# Patient Record
Sex: Female | Born: 1950 | Race: White | Hispanic: No | Marital: Married | Smoking: Never smoker
Health system: Southern US, Community
[De-identification: ages and names within clinical notes are randomized; demographics above are authoritative.]

## PROBLEM LIST (undated history)

## (undated) DIAGNOSIS — K224 Dyskinesia of esophagus: Secondary | ICD-10-CM

## (undated) DIAGNOSIS — R112 Nausea with vomiting, unspecified: Secondary | ICD-10-CM

## (undated) DIAGNOSIS — C801 Malignant (primary) neoplasm, unspecified: Secondary | ICD-10-CM

## (undated) DIAGNOSIS — I10 Essential (primary) hypertension: Secondary | ICD-10-CM

## (undated) DIAGNOSIS — K219 Gastro-esophageal reflux disease without esophagitis: Secondary | ICD-10-CM

## (undated) DIAGNOSIS — M1990 Unspecified osteoarthritis, unspecified site: Secondary | ICD-10-CM

## (undated) DIAGNOSIS — F419 Anxiety disorder, unspecified: Secondary | ICD-10-CM

## (undated) DIAGNOSIS — Z9889 Other specified postprocedural states: Secondary | ICD-10-CM

## (undated) HISTORY — PX: OTHER SURGICAL HISTORY: SHX169

## (undated) HISTORY — PX: KNEE SURGERY: SHX244

## (undated) HISTORY — PX: TUBAL LIGATION: SHX77

---

## 1978-11-29 HISTORY — PX: DILATION AND CURETTAGE OF UTERUS: SHX78

## 1998-08-06 ENCOUNTER — Ambulatory Visit (HOSPITAL_COMMUNITY): Admission: RE | Admit: 1998-08-06 | Discharge: 1998-08-06 | Payer: Self-pay | Admitting: Thoracic Surgery

## 2000-03-07 ENCOUNTER — Encounter: Payer: Self-pay | Admitting: Thoracic Surgery

## 2000-03-08 ENCOUNTER — Encounter (INDEPENDENT_AMBULATORY_CARE_PROVIDER_SITE_OTHER): Payer: Self-pay | Admitting: *Deleted

## 2000-03-08 ENCOUNTER — Ambulatory Visit (HOSPITAL_COMMUNITY): Admission: RE | Admit: 2000-03-08 | Discharge: 2000-03-08 | Payer: Self-pay | Admitting: Thoracic Surgery

## 2002-04-11 ENCOUNTER — Encounter: Admission: RE | Admit: 2002-04-11 | Discharge: 2002-04-11 | Payer: Self-pay | Admitting: Family Medicine

## 2002-04-11 ENCOUNTER — Encounter: Payer: Self-pay | Admitting: Family Medicine

## 2002-05-28 ENCOUNTER — Other Ambulatory Visit: Admission: RE | Admit: 2002-05-28 | Discharge: 2002-05-28 | Payer: Self-pay | Admitting: *Deleted

## 2003-12-12 ENCOUNTER — Encounter: Admission: RE | Admit: 2003-12-12 | Discharge: 2003-12-12 | Payer: Self-pay | Admitting: Family Medicine

## 2004-02-20 ENCOUNTER — Ambulatory Visit (HOSPITAL_COMMUNITY): Admission: RE | Admit: 2004-02-20 | Discharge: 2004-02-20 | Payer: Self-pay | Admitting: *Deleted

## 2005-06-03 ENCOUNTER — Other Ambulatory Visit: Admission: RE | Admit: 2005-06-03 | Discharge: 2005-06-03 | Payer: Self-pay | Admitting: *Deleted

## 2008-05-14 ENCOUNTER — Encounter: Admission: RE | Admit: 2008-05-14 | Discharge: 2008-05-14 | Payer: Self-pay | Admitting: Obstetrics and Gynecology

## 2009-08-27 ENCOUNTER — Encounter: Admission: RE | Admit: 2009-08-27 | Discharge: 2009-08-27 | Payer: Self-pay | Admitting: Obstetrics and Gynecology

## 2010-04-18 ENCOUNTER — Emergency Department (HOSPITAL_BASED_OUTPATIENT_CLINIC_OR_DEPARTMENT_OTHER): Admission: EM | Admit: 2010-04-18 | Discharge: 2010-04-18 | Payer: Self-pay | Admitting: Emergency Medicine

## 2010-04-18 ENCOUNTER — Ambulatory Visit: Payer: Self-pay | Admitting: Diagnostic Radiology

## 2010-09-07 ENCOUNTER — Encounter: Admission: RE | Admit: 2010-09-07 | Discharge: 2010-09-07 | Payer: Self-pay | Admitting: Family Medicine

## 2011-02-15 LAB — BASIC METABOLIC PANEL
Calcium: 9.6 mg/dL (ref 8.4–10.5)
GFR calc Af Amer: >60 mL/min (ref >60)
GFR calc non Af Amer: >60 mL/min (ref >60)
Glucose, Bld: 106 mg/dL — ABNORMAL HIGH (ref 70–99)
Potassium: 3.6 mEq/L (ref 3.5–5.1)
Sodium: 143 mEq/L (ref 135–145)

## 2011-02-15 LAB — CBC
HCT: 39.2 % (ref 36.0–46.0)
Hemoglobin: 13.3 g/dL (ref 12.0–15.0)
RBC: 4.78 MIL/uL (ref 3.87–5.11)
RDW: 13.5 % (ref 11.5–15.5)
WBC: 8.9 10*3/uL (ref 4.0–10.5)

## 2011-02-15 LAB — POCT CARDIAC MARKERS
CKMB, poc: <1 ng/mL — ABNORMAL LOW (ref 1.0–8.0)
Myoglobin, poc: 54 ng/mL (ref 12–200)
Troponin i, poc: <0.05 ng/mL (ref 0.00–0.09)

## 2012-11-29 HISTORY — PX: KNEE SURGERY: SHX244

## 2013-03-12 ENCOUNTER — Other Ambulatory Visit: Payer: Self-pay

## 2013-03-12 DIAGNOSIS — Z1231 Encounter for screening mammogram for malignant neoplasm of breast: Secondary | ICD-10-CM

## 2013-03-14 ENCOUNTER — Ambulatory Visit
Admission: RE | Admit: 2013-03-14 | Discharge: 2013-03-14 | Disposition: A | Discharge status: Home / Self Care | Payer: 59 | Source: Ambulatory Visit

## 2013-03-14 DIAGNOSIS — Z1231 Encounter for screening mammogram for malignant neoplasm of breast: Secondary | ICD-10-CM

## 2013-03-15 ENCOUNTER — Ambulatory Visit: Payer: Self-pay

## 2013-10-22 ENCOUNTER — Other Ambulatory Visit: Payer: Self-pay | Admitting: Gastroenterology

## 2013-10-22 DIAGNOSIS — R1310 Dysphagia, unspecified: Secondary | ICD-10-CM

## 2013-10-23 ENCOUNTER — Ambulatory Visit
Admission: RE | Admit: 2013-10-23 | Discharge: 2013-10-23 | Disposition: A | Discharge status: Home / Self Care | Payer: 59 | Source: Ambulatory Visit | Attending: Gastroenterology | Admitting: Gastroenterology

## 2013-10-23 DIAGNOSIS — R1310 Dysphagia, unspecified: Secondary | ICD-10-CM

## 2013-11-28 ENCOUNTER — Ambulatory Visit: Payer: 59 | Admitting: Internal Medicine

## 2014-10-02 ENCOUNTER — Encounter (HOSPITAL_COMMUNITY): Payer: Self-pay | Admitting: Emergency Medicine

## 2014-10-02 ENCOUNTER — Emergency Department (HOSPITAL_COMMUNITY)
Admission: EM | Admit: 2014-10-02 | Discharge: 2014-10-02 | Disposition: A | Discharge status: Home / Self Care | Payer: BC Managed Care – PPO | Attending: Emergency Medicine | Admitting: Emergency Medicine

## 2014-10-02 DIAGNOSIS — R1310 Dysphagia, unspecified: Secondary | ICD-10-CM | POA: Diagnosis present

## 2014-10-02 DIAGNOSIS — Z7982 Long term (current) use of aspirin: Secondary | ICD-10-CM | POA: Insufficient documentation

## 2014-10-02 DIAGNOSIS — K224 Dyskinesia of esophagus: Secondary | ICD-10-CM | POA: Insufficient documentation

## 2014-10-02 DIAGNOSIS — M542 Cervicalgia: Secondary | ICD-10-CM | POA: Insufficient documentation

## 2014-10-02 DIAGNOSIS — Z8719 Personal history of other diseases of the digestive system: Secondary | ICD-10-CM | POA: Diagnosis not present

## 2014-10-02 DIAGNOSIS — Z79899 Other long term (current) drug therapy: Secondary | ICD-10-CM | POA: Insufficient documentation

## 2014-10-02 DIAGNOSIS — I10 Essential (primary) hypertension: Secondary | ICD-10-CM | POA: Insufficient documentation

## 2014-10-02 HISTORY — DX: Essential (primary) hypertension: I10

## 2014-10-02 HISTORY — DX: Gastro-esophageal reflux disease without esophagitis: K21.9

## 2014-10-02 MED ORDER — GLUCAGON HCL RDNA (DIAGNOSTIC) 1 MG IJ SOLR
1.0000 mg | Freq: Once | INTRAMUSCULAR | Status: DC
  Filled 2014-10-02: qty 1

## 2014-10-02 MED ORDER — IBUPROFEN 100 MG/5ML PO SUSP
400.0000 mg | Freq: Once | ORAL | Status: AC
  Administered 2014-10-02: 400 mg via ORAL
  Filled 2014-10-02: qty 20

## 2014-10-02 NOTE — Progress Notes (Signed)
  CARE MANAGEMENT ED NOTE 10/02/2014  Patient:  Courtney Hurley,Courtney Hurley   Account Number:  [id number]  Date Initiated:  10/02/2014  Documentation initiated by:  Radford PaxFERRERO,Kara Mierzejewski  Subjective/Objective Assessment:   Patient presents to Ed with upper chest tightness rediating wit throat with spasms.     Subjective/Objective Assessment Detail:   Patient with pmhx of GERD     Action/Plan:   Action/Plan Detail:   Anticipated DC Date:       Status Recommendation to Physician:   Result of Recommendation:    Other ED Services  Consult Working Plan    DC Planning Services  Other  PCP issues    Choice offered to / List presented to:            Status of service:  Completed, signed off  ED Comments:   ED Comments Detail:  EDCM spoke to patient ta bedside.  Patient confirms her pcp is Dr. Miguel AschoffAllan ross.  Patinet also reports her GI specialist id [facility]  System updated.

## 2014-10-02 NOTE — ED Notes (Signed)
Bed: WA14 Expected date:  Expected time:  Means of arrival:  Comments: Triage 2  

## 2014-10-02 NOTE — ED Notes (Signed)
Patient states her spasms have stopped for the past 20 minutes. Patient states previously the spasms were occuring every 5 minutes, over the prior 5 hours.

## 2014-10-02 NOTE — ED Provider Notes (Signed)
CSN: [id number]     Arrival date & time 10/02/14  2015 History   First MD Initiated Contact with Patient 10/02/14 2048     Chief Complaint  Patient presents with  . Foreign Body     (Consider location/radiation/quality/duration/timing/severity/associated sxs/prior Treatment) HPI [name] is a 63 year old female with past medical history of GERD, hypertension who presents to the ER with the sensation of a foreign body stuck in her esophagus. Patient states she was eating chicken tonight, and after her first bite she noticed a sudden onset of pain in her throat. Patient states since then she has been vomiting saliva. She states she was unable to get the chicken back up, and since then has not been able to swallow solid or liquid.patient reports a similar episode approximately 6 months ago for which she saw gastroenterology. Patient states the episode tonight feels identical to then. She placed on Zantac and since has had a negative endoscopy approximately 1-2 months ago. On presentation, patient is unable to tolerate any of her secretions or liquids. Patient denies any nausea, shortness of breath, chest pain, dizziness, weakness, syncope, abdominal pain, diarrhea, dysuria.  Past Medical History  Diagnosis Date  . GERD (gastroesophageal reflux disease)   . Hypertension    Past Surgical History  Procedure Laterality Date  . Knee surgery     History reviewed. No pertinent family history. History  Substance Use Topics  . Smoking status: Never Smoker   . Smokeless tobacco: Not on file  . Alcohol Use: No   OB History    No data available     Review of Systems  Constitutional: Negative for fever.  HENT: Negative for trouble swallowing.   Eyes: Negative for visual disturbance.  Respiratory: Negative for shortness of breath.   Cardiovascular: Negative for chest pain.  Gastrointestinal: Negative for nausea, vomiting and abdominal pain.       Dysphagia  Genitourinary: Negative for  dysuria.  Musculoskeletal: Positive for neck pain.       Anterior neck pain superior to sternal notch.  Skin: Negative for rash.  Neurological: Negative for dizziness, weakness and numbness.  Psychiatric/Behavioral: Negative.       Allergies  Review of patient's allergies indicates no known allergies.  Home Medications   Prior to Admission medications   Medication Sig Start Date End Date Taking? Authorizing Provider  aspirin 81 MG tablet Take 81 mg by mouth daily.   Yes Historical Provider, MD  FLUoxetine (PROZAC) 20 MG capsule Take 20 mg by mouth daily.   Yes Historical Provider, MD  Multiple Vitamins-Minerals (MULTIVITAMIN & MINERAL PO) Take 1 tablet by mouth daily.   Yes Historical Provider, MD  Nutritional Supplements (NUTRITIONAL SUPPLEMENT PO) Take 1 tablet by mouth daily.   Yes Historical Provider, MD  olmesartan (BENICAR) 20 MG tablet Take 20 mg by mouth daily.   Yes Historical Provider, MD   BP 125/61 mmHg  Pulse 76  Temp(Src) 98.7 F (37.1 C) (Oral)  Resp 16  Ht 5\' 6"  (1.676 m)  Wt 192 lb (87.091 kg)  BMI 31.00 kg/m2  SpO2 98% Physical Exam  Constitutional: She appears well-developed and well-nourished. No distress.  HENT:  Head: Normocephalic and atraumatic.  Mouth/Throat: Oropharynx is clear and moist. No oropharyngeal exudate.  Eyes: Right eye exhibits no discharge. Left eye exhibits no discharge. No scleral icterus.  Neck: Normal range of motion.  Cardiovascular: Normal rate, regular rhythm and normal heart sounds.   No murmur heard. Pulmonary/Chest: Effort normal and breath sounds  normal. No respiratory distress.  Abdominal: Soft. There is no tenderness.  Musculoskeletal: Normal range of motion. She exhibits no edema or tenderness.  Neurological: She is alert. She has normal strength. No cranial nerve deficit or sensory deficit. Coordination normal. GCS eye subscore is 4. GCS verbal subscore is 5. GCS motor subscore is 6.  Patient fully alert, answering  questions appropriately in full, clear sentences.  Skin: Skin is warm and dry. No rash noted. She is not diaphoretic.  Psychiatric: She has a normal mood and affect.  Nursing note and vitals reviewed.   ED Course  Procedures (including critical care time) Labs Review Labs Reviewed - No data to display  Imaging Review No results found.   EKG Interpretation   Date/Time:  Wednesday October 02 2014 20:27:55 EST Ventricular Rate:  84 PR Interval:  164 QRS Duration: 104 QT Interval:  524 QTC Calculation: 619 R Axis:   -77 Text Interpretation:  Normal sinus rhythm Incomplete right bundle branch  block Left anterior fascicular block Prolonged QT Abnormal ECG No  significant change since last tracing Confirmed by [facility]  MD, MARTHA  [phone]) on 10/02/2014 8:53:23 PM      MDM   Final diagnoses:  Esophageal spasm    Hx of esophageal motility disorder, feeling FB in esophagus tonight, unable to tolerate liquids now.  Will try Glucagon.  Patient well-appearing, and in no obvious distress. Patient's pain is located in her throat, and she is stating is identical to a neck and discomfort she had with a previous spasm of her esophagus. No concern for ACS, foreign body in the airway. No concern for aspiration at this point.  Just prior to patient receiving IV for glucagon, patient states her symptoms have completely resolved. Patient reporting that she is asymptomatic, and willing to attempt by mouth challenge with water.  Patient able to drink fluids without any difficulty. Patient tolerating her secretions well, has drank approximately 8-10 ounces of water, and is requesting to go home. Since patient is able to tolerate her own secretions at this point, we will discharge patient home and have her follow-up with her gastroenterologist as an outpatient. I recommended soft foods for patient including yogurt and plenty of fluids as much as she can tolerate tonight. I discussed return precautions  with patient, and patient was agreeable to this plan. I encouraged patient to call or return to the ER should her symptoms return, persist, change or should she have any questions or concerns.  BP 125/61 mmHg  Pulse 76  Temp(Src) 98.7 F (37.1 C) (Oral)  Resp 16  Ht 5\' 6"  (1.676 m)  Wt 192 lb (87.091 kg)  BMI 31.00 kg/m2  SpO2 98%  Signed,  Ladona MowJoe Fayth Trefry, PA-C 2:11 AM  This patient discussed with Dr. Jerelyn ScottMartha Linker, M.D.  Monte FantasiaJoseph W Braya Habermehl, PA-C 10/03/14 [phone]  Ethelda ChickMartha K Linker, MD 10/03/14 [phone]

## 2014-10-02 NOTE — ED Notes (Addendum)
Pt presents with upper central chest tightness radiating to throat, describes as waves of spasms. Pt states this happened before but was brief and had complete GI workup. Pt states she is only able to get saliva to come up. Pt states this began while eating salad and chicken. NAD, A & O Pt given water to attempt to swallow, pt immediately vomited water up.

## 2014-10-02 NOTE — ED Notes (Signed)
Patient states she is feeling much better and would like to go home. PA updated.

## 2014-10-02 NOTE — ED Notes (Signed)
Patient given 1/2 cup water, drank from open cup, small sips, was able to drink without difficulty. Patient given ginger-ale, requested patient to sip slowly. Patient c/o headache 3/10, would like something for headache, is concerned about swallowing pills. Will notify PA.

## 2014-10-02 NOTE — Discharge Instructions (Signed)
Esophageal Spasm °Esophageal spasm is an uncoordinated contraction of the muscles of the esophagus (the tube which carries food from your mouth to your stomach). Normally, the muscles of the esophagus alternate between contraction and relaxation starting from the top of the esophagus and working down to the bottom. This moves the food from the mouth to the stomach. In esophageal spasm, all the muscles contract at once. This causes pain and fails to move the food along. As a result, you may have trouble swallowing.  °Women are more likely than men to have esophageal spasm. The cause of the spasms is not known. Sometimes eating hot or cold foods triggers the condition and this may be due to an overly sensitive esophagus. This is not an infectious disease and cannot be passed to others. °SYMPTOMS  °Symptoms of esophageal spasm may include: chest pain, burning or pain with swallowing, and difficulty swallowing.  °DIAGNOSIS  °Esophageal spasm can be diagnosed by a test called manometry (pressure studies of the esophagus). In this test, a special tube is inserted down the esophagus. The tube measures the muscle activity of the esophagus. Abnormal contractions mixed with normal movement helps confirm the diagnosis.  °A person with a hypersensitive esophagus may be diagnosed by inflating a long balloon in the person's esophagus. If this causes the same symptoms, preventive methods may work. °PREVENTION  °Avoid hot or cold foods if that seems to be a trigger. °PROGNOSIS  °This condition does not go away, nor is treatment entirely satisfactory. Patients need to be careful of what they eat. They need to continue on medication if a useful one is found. Fortunately, the condition does not get progressively worse as time passes. °Esophageal spasm does not usually lead to more serious problems but sometimes the pain can be disabling. If a person becomes afraid to eat they may become malnourished and lose weight.  °TREATMENT  °· A  procedure in which instruments of increasing size are inserted through the esophagus to enlarge (dilate) it are used. °· Medications that decrease acid-production of the stomach may be used such as proton-pump inhibitors or H2-blockers. °· Medications of several types can be used to relax the muscles of the esophagus. °· An individual with a hypersensitive esophagus sometimes improves with low doses of medications normally used for depression. °· No treatment for esophageal spasm is effective for everyone. Often several approaches will be tried before one works. In many cases, the symptoms will improve, but will not go away completely. °· For severe cases, relief is obtained two-thirds of the time by cutting the muscles along the entire length of the esophagus. This is a major surgical procedure. °· Your symptoms are usually the best guide to how well the treatment for esophageal spasm works. °SIDE EFFECTS OF TREATMENTS °· Nitrates can cause headaches and low blood pressure. °· Calcium channel blockers can cause: °¨ Feeling sick to your stomach (nausea). °¨ Constipation and other side effects. °· Antidepressants can cause side effects that depend on the medication used. °HOME CARE INSTRUCTIONS  °· Let your caregiver know if problems are getting worse, or if you get food stuck in your esophagus for longer than 1 hour or as directed and are unable to swallow liquid. °· Take medications as directed and with permission of your caregiver. Ask about what to do if a medication seems to get stuck in your esophagus. Only take over-the-counter or prescription medicines for pain, discomfort, or fever as directed by your caregiver. °· Soft and liquid foods   pass more easily than solid pieces. °SEEK IMMEDIATE MEDICAL CARE IF:  °· You develop severe chest pain, especially if the pain is crushing or pressure-like and spreads to the arms, back, neck, or jaw, or if you have sweating, nausea, or shortness of breath. THIS COULD BE AN  EMERGENCY. Do not wait to see if the pain will go away. Get medical help at once. Call 911 or 0 (operator). DO NOT drive yourself to the hospital. °· Your chest pain gets worse and does not go away with rest. °· You have an attack of chest pain lasting longer than usual despite rest and treatment with the medications your physician has prescribed. °· You wake from sleep with chest pain or shortness of breath. °· You feel dizzy or faint. °· You have chest pain, not typical of your usual pain, caused by your esophagus for which you originally saw your caregiver. °MAKE SURE YOU:  °· Understand these instructions. °· Will watch your condition. °· Will get help right away if you are not doing well or get worse. °Document Released: 02/05/2003 Document Revised: 02/07/2012 Document Reviewed: 02/08/2014 °[employer]® Patient Information ©2015 [employer]. This information is not intended to replace advice given to you by your health care provider. Make sure you discuss any questions you have with your health care provider. ° °

## 2014-10-03 NOTE — ED Notes (Signed)
Patient needed d/c paper work

## 2015-03-07 ENCOUNTER — Other Ambulatory Visit: Payer: Self-pay | Admitting: Orthopedic Surgery

## 2015-03-07 DIAGNOSIS — M1712 Unilateral primary osteoarthritis, left knee: Secondary | ICD-10-CM

## 2015-05-19 ENCOUNTER — Other Ambulatory Visit: Payer: Self-pay | Admitting: Obstetrics and Gynecology

## 2015-05-19 DIAGNOSIS — E2839 Other primary ovarian failure: Secondary | ICD-10-CM

## 2015-06-04 ENCOUNTER — Ambulatory Visit
Admission: RE | Admit: 2015-06-04 | Discharge: 2015-06-04 | Disposition: A | Discharge status: Home / Self Care | Payer: Medicare Other | Source: Ambulatory Visit | Attending: Orthopedic Surgery | Admitting: Orthopedic Surgery

## 2015-06-04 DIAGNOSIS — M1712 Unilateral primary osteoarthritis, left knee: Secondary | ICD-10-CM

## 2015-07-04 ENCOUNTER — Other Ambulatory Visit: Payer: BC Managed Care – PPO

## 2015-07-14 ENCOUNTER — Ambulatory Visit: Payer: Self-pay | Admitting: Orthopedic Surgery

## 2015-07-14 NOTE — Progress Notes (Signed)
Preoperative surgical orders have been place into the [facility] hospital system for Courtney Hurley on 07/14/2015, 12:17 PM  by Patrica Duel for surgery on 07/28/2015.  Preop Uni Knee orders including Experal, IV Tylenol, and IV Decadron as long as there are no contraindications to the above medications. Avel Peace, PA-C

## 2015-07-15 NOTE — Patient Instructions (Signed)
HAELI GERLICH  07/15/2015   Your procedure is scheduled on: 07/28/2015    Report to [facility] Main  Entrance take [facility]  elevators to 3rd floor to  Short Stay Center at    0600 AM.  Call this number if you have problems the morning of surgery [phone]   Remember: ONLY 1 PERSON MAY GO WITH YOU TO SHORT STAY TO GET  READY MORNING OF YOUR SURGERY.  Do not eat food or drink liquids :After Midnight.     Take these medicines the morning of surgery with A SIP OF WATER: none                                You may not have any metal on your body including hair pins and              piercings  Do not wear jewelry, make-up, lotions, powders or perfumes, deodorant             Do not wear nail polish.  Do not shave  48 hours prior to surgery.               Do not bring valuables to the hospital. [facility] IS NOT             RESPONSIBLE   FOR VALUABLES.  Contacts, dentures or bridgework may not be worn into surgery.  Leave suitcase in the car. After surgery it may be brought to your room.         Special Instructions: coughing and deep breathing exercises, leg exercises               Please read over the following fact sheets you were given: _____________________________________________________________________             [facility] - Preparing for Surgery Before surgery, you can play an important role.  Because skin is not sterile, your skin needs to be as free of germs as possible.  You can reduce the number of germs on your skin by washing with CHG (chlorahexidine gluconate) soap before surgery.  CHG is an antiseptic cleaner which kills germs and bonds with the skin to continue killing germs even after washing. Please DO NOT use if you have an allergy to CHG or antibacterial soaps.  If your skin becomes reddened/irritated stop using the CHG and inform your nurse when you arrive at Short Stay. Do not shave (including legs and underarms) for at least 48  hours prior to the first CHG shower.  You may shave your face/neck. Please follow these instructions carefully:  1.  Shower with CHG Soap the night before surgery and the  morning of Surgery.  2.  If you choose to wash your hair, wash your hair first as usual with your  normal  shampoo.  3.  After you shampoo, rinse your hair and body thoroughly to remove the  shampoo.                           4.  Use CHG as you would any other liquid soap.  You can apply chg directly  to the skin and wash                       Gently with a scrungie  or clean washcloth.  5.  Apply the CHG Soap to your body ONLY FROM THE NECK DOWN.   Do not use on face/ open                           Wound or open sores. Avoid contact with eyes, ears mouth and genitals (private parts).                       Wash face,  Genitals (private parts) with your normal soap.             6.  Wash thoroughly, paying special attention to the area where your surgery  will be performed.  7.  Thoroughly rinse your body with warm water from the neck down.  8.  DO NOT shower/wash with your normal soap after using and rinsing off  the CHG Soap.                9.  Pat yourself dry with a clean towel.            10.  Wear clean pajamas.            11.  Place clean sheets on your bed the night of your first shower and do not  sleep with pets. Day of Surgery : Do not apply any lotions/deodorants the morning of surgery.  Please wear clean clothes to the hospital/surgery center.  FAILURE TO FOLLOW THESE INSTRUCTIONS MAY RESULT IN THE CANCELLATION OF YOUR SURGERY PATIENT SIGNATURE_________________________________  NURSE SIGNATURE__________________________________  ________________________________________________________________________  WHAT IS A BLOOD TRANSFUSION? Blood Transfusion Information  A transfusion is the replacement of blood or some of its parts. Blood is made up of multiple cells which provide different functions.  Red blood cells  carry oxygen and are used for blood loss replacement.  White blood cells fight against infection.  Platelets control bleeding.  Plasma helps clot blood.  Other blood products are available for specialized needs, such as hemophilia or other clotting disorders. BEFORE THE TRANSFUSION  Who gives blood for transfusions?   Healthy volunteers who are fully evaluated to make sure their blood is safe. This is blood bank blood. Transfusion therapy is the safest it has ever been in the practice of medicine. Before blood is taken from a donor, a complete history is taken to make sure that person has no history of diseases nor engages in risky social behavior (examples are intravenous drug use or sexual activity with multiple partners). The donor's travel history is screened to minimize risk of transmitting infections, such as malaria. The donated blood is tested for signs of infectious diseases, such as HIV and hepatitis. The blood is then tested to be sure it is compatible with you in order to minimize the chance of a transfusion reaction. If you or a relative donates blood, this is often done in anticipation of surgery and is not appropriate for emergency situations. It takes many days to process the donated blood. RISKS AND COMPLICATIONS Although transfusion therapy is very safe and saves many lives, the main dangers of transfusion include:  1. Getting an infectious disease. 2. Developing a transfusion reaction. This is an allergic reaction to something in the blood you were given. Every precaution is taken to prevent this. The decision to have a blood transfusion has been considered carefully by your caregiver before blood is given. Blood is not given unless the benefits outweigh the risks. AFTER THE  TRANSFUSION  Right after receiving a blood transfusion, you will usually feel much better and more energetic. This is especially true if your red blood cells have gotten low (anemic). The transfusion  raises the level of the red blood cells which carry oxygen, and this usually causes an energy increase.  The nurse administering the transfusion will monitor you carefully for complications. HOME CARE INSTRUCTIONS  No special instructions are needed after a transfusion. You may find your energy is better. Speak with your caregiver about any limitations on activity for underlying diseases you may have. SEEK MEDICAL CARE IF:   Your condition is not improving after your transfusion.  You develop redness or irritation at the intravenous (IV) site. SEEK IMMEDIATE MEDICAL CARE IF:  Any of the following symptoms occur over the next 12 hours:  Shaking chills.  You have a temperature by mouth above 102 F (38.9 C), not controlled by medicine.  Chest, back, or muscle pain.  People around you feel you are not acting correctly or are confused.  Shortness of breath or difficulty breathing.  Dizziness and fainting.  You get a rash or develop hives.  You have a decrease in urine output.  Your urine turns a dark color or changes to pink, red, or brown. Any of the following symptoms occur over the next 10 days:  You have a temperature by mouth above 102 F (38.9 C), not controlled by medicine.  Shortness of breath.  Weakness after normal activity.  The white part of the eye turns yellow (jaundice).  You have a decrease in the amount of urine or are urinating less often.  Your urine turns a dark color or changes to pink, red, or brown. Document Released: 11/12/2000 Document Revised: 02/07/2012 Document Reviewed: 07/01/2008 Exit[employer] Patient Information 2014 [place], [place].  _______________________________________________________________________  Incentive Spirometer  An incentive spirometer is a tool that can help keep your lungs clear and active. This tool measures how well you are filling your lungs with each breath. Taking long deep breaths may help reverse or decrease the chance  of developing breathing (pulmonary) problems (especially infection) following:  A long period of time when you are unable to move or be active. BEFORE THE PROCEDURE   If the spirometer includes an indicator to show your best effort, your nurse or respiratory therapist will set it to a desired goal.  If possible, sit up straight or lean slightly forward. Try not to slouch.  Hold the incentive spirometer in an upright position. INSTRUCTIONS FOR USE  3. Sit on the edge of your bed if possible, or sit up as far as you can in bed or on a chair. 4. Hold the incentive spirometer in an upright position. 5. Breathe out normally. 6. Place the mouthpiece in your mouth and seal your lips tightly around it. 7. Breathe in slowly and as deeply as possible, raising the piston or the ball toward the top of the column. 8. Hold your breath for 3-5 seconds or for as long as possible. Allow the piston or ball to fall to the bottom of the column. 9. Remove the mouthpiece from your mouth and breathe out normally. 10. Rest for a few seconds and repeat Steps 1 through 7 at least 10 times every 1-2 hours when you are awake. Take your time and take a few normal breaths between deep breaths. 11. The spirometer may include an indicator to show your best effort. Use the indicator as a goal to work toward during each repetition.  12. After each set of 10 deep breaths, practice coughing to be sure your lungs are clear. If you have an incision (the cut made at the time of surgery), support your incision when coughing by placing a pillow or rolled up towels firmly against it. Once you are able to get out of bed, walk around indoors and cough well. You may stop using the incentive spirometer when instructed by your caregiver.  RISKS AND COMPLICATIONS  Take your time so you do not get dizzy or light-headed.  If you are in pain, you may need to take or ask for pain medication before doing incentive spirometry. It is harder to  take a deep breath if you are having pain. AFTER USE  Rest and breathe slowly and easily.  It can be helpful to keep track of a log of your progress. Your caregiver can provide you with a simple table to help with this. If you are using the spirometer at home, follow these instructions: SEEK MEDICAL CARE IF:   You are having difficultly using the spirometer.  You have trouble using the spirometer as often as instructed.  Your pain medication is not giving enough relief while using the spirometer.  You develop fever of 100.5 F (38.1 C) or higher. SEEK IMMEDIATE MEDICAL CARE IF:   You cough up bloody sputum that had not been present before.  You develop fever of 102 F (38.9 C) or greater.  You develop worsening pain at or near the incision site. MAKE SURE YOU:   Understand these instructions.  Will watch your condition.  Will get help right away if you are not doing well or get worse. Document Released: 03/28/2007 Document Revised: 02/07/2012 Document Reviewed: 05/29/2007 [facility] Patient Information 2014 [place], [place].   ________________________________________________________________________

## 2015-07-16 ENCOUNTER — Encounter (HOSPITAL_COMMUNITY)
Admission: RE | Admit: 2015-07-16 | Discharge: 2015-07-16 | Disposition: A | Discharge status: Home / Self Care | Payer: Medicare Other | Source: Ambulatory Visit | Attending: Orthopedic Surgery | Admitting: Orthopedic Surgery

## 2015-07-16 ENCOUNTER — Encounter (HOSPITAL_COMMUNITY): Payer: Self-pay

## 2015-07-16 DIAGNOSIS — Z01818 Encounter for other preprocedural examination: Secondary | ICD-10-CM | POA: Diagnosis present

## 2015-07-16 HISTORY — DX: Anxiety disorder, unspecified: F41.9

## 2015-07-16 HISTORY — DX: Dyskinesia of esophagus: K22.4

## 2015-07-16 HISTORY — DX: Other specified postprocedural states: Z98.890

## 2015-07-16 HISTORY — DX: Unspecified osteoarthritis, unspecified site: M19.90

## 2015-07-16 HISTORY — DX: Other specified postprocedural states: R11.2

## 2015-07-16 LAB — CBC
HCT: 38.5 % (ref 36.0–46.0)
HEMOGLOBIN: 12.8 g/dL (ref 12.0–15.0)
MCH: 27 pg (ref 26.0–34.0)
MCHC: 33.2 g/dL (ref 30.0–36.0)
MCV: 81.2 fL (ref 78.0–100.0)
PLATELETS: 233 10*3/uL (ref 150–400)
RBC: 4.74 MIL/uL (ref 3.87–5.11)
RDW: 13.5 % (ref 11.5–15.5)
WBC: 6.1 10*3/uL (ref 4.0–10.5)

## 2015-07-16 LAB — COMPREHENSIVE METABOLIC PANEL
ALK PHOS: 75 U/L (ref 38–126)
ALT: 22 U/L (ref 14–54)
ANION GAP: 8 (ref 5–15)
AST: 22 U/L (ref 15–41)
Albumin: 4.3 g/dL (ref 3.5–5.0)
BUN: 16 mg/dL (ref 6–20)
CALCIUM: 9.4 mg/dL (ref 8.9–10.3)
CO2: 29 mmol/L (ref 22–32)
Chloride: 100 mmol/L — ABNORMAL LOW (ref 101–111)
Creatinine, Ser: 0.7 mg/dL (ref 0.44–1.00)
Glucose, Bld: 106 mg/dL — ABNORMAL HIGH (ref 65–99)
Potassium: 3.3 mmol/L — ABNORMAL LOW (ref 3.5–5.1)
SODIUM: 137 mmol/L (ref 135–145)
Total Bilirubin: 0.9 mg/dL (ref 0.3–1.2)
Total Protein: 7.5 g/dL (ref 6.5–8.1)

## 2015-07-16 LAB — ABO/RH: ABO/RH(D): O POS

## 2015-07-16 LAB — PROTIME-INR
INR: 1 (ref 0.00–1.49)
PROTHROMBIN TIME: 13.4 s (ref 11.6–15.2)

## 2015-07-16 LAB — URINALYSIS, ROUTINE W REFLEX MICROSCOPIC
Bilirubin Urine: NEGATIVE
GLUCOSE, UA: NEGATIVE mg/dL
HGB URINE DIPSTICK: NEGATIVE
Ketones, ur: NEGATIVE mg/dL
Nitrite: NEGATIVE
PH: 7 (ref 5.0–8.0)
PROTEIN: NEGATIVE mg/dL
SPECIFIC GRAVITY, URINE: 1.014 (ref 1.005–1.030)
Urobilinogen, UA: 0.2 mg/dL (ref 0.0–1.0)

## 2015-07-16 LAB — APTT: aPTT: 36 seconds (ref 24–37)

## 2015-07-16 LAB — URINE MICROSCOPIC-ADD ON

## 2015-07-16 LAB — SURGICAL PCR SCREEN
MRSA, PCR: NEGATIVE
Staphylococcus aureus: NEGATIVE

## 2015-07-16 NOTE — Progress Notes (Signed)
EKG- 10/04/2014-EPIC  Clearance- Dr Tenny Craw- 02/19/15- chart

## 2015-07-16 NOTE — Progress Notes (Signed)
U/A with micro results done 07/16/15 faxed via EPIC to Dr Lequita Halt.

## 2015-07-17 LAB — TYPE AND SCREEN
ABO/RH(D): O POS
Antibody Screen: NEGATIVE

## 2015-07-21 ENCOUNTER — Other Ambulatory Visit (HOSPITAL_COMMUNITY): Payer: Medicare Other

## 2015-07-22 NOTE — H&P (Signed)
TOTAL KNEE ADMISSION H&P  Patient is being admitted for left medial unicompartmental knee arthroplasty.  Subjective:  Chief Complaint:left knee pain.  HPI: [facility], 64 y.o. female, has a history of pain and functional disability in the left knee due to arthritis and has failed non-surgical conservative treatments for greater than 12 weeks to includeNSAID's and/or analgesics, corticosteriod injections, viscosupplementation injections and activity modification.  Onset of symptoms was gradual, starting >10 years ago with gradually worsening course since that time. The patient noted prior procedures on the knee to include  arthroscopy and menisectomy on the left knee(s).  Patient currently rates pain in the left knee(s) at 7 out of 10 with activity. Patient has night pain, worsening of pain with activity and weight bearing, pain that interferes with activities of daily living, pain with passive range of motion, crepitus and joint swelling.  Patient has evidence of periarticular osteophytes and joint space narrowing by imaging studies. There is no active infection.     Past Medical History  Diagnosis Date  . Hypertension   . PONV (postoperative nausea and vomiting)   . Anxiety   . GERD (gastroesophageal reflux disease)     hx of   . Esophageal spasm   . Arthritis     Past Surgical History  Procedure Laterality Date  . Knee surgery      meniscus   . Tubal ligation    . Ligation of veins       right leg   . Cataract surgery       x 6     Current outpatient prescriptions:  .  aspirin 81 MG tablet, Take 81 mg by mouth daily., Disp: , Rfl:  .  citalopram (CELEXA) 10 MG tablet, Take 10 mg by mouth at bedtime., Disp: , Rfl:  .  losartan-hydrochlorothiazide (HYZAAR) 100-25 MG per tablet, Take 1 tablet by mouth every morning., Disp: , Rfl:  .  Multiple Vitamins-Minerals (MULTIVITAMIN & MINERAL PO), Take 1 tablet by mouth daily., Disp: , Rfl:  .  vitamin B-12 (CYANOCOBALAMIN) 1000  MCG tablet, Take 1,000 mcg by mouth daily. Patient takes every few days, Disp: , Rfl:   Allergies  Allergen Reactions  . Erythromycin Nausea Only    Stomach cramps    Social History  Substance Use Topics  . Smoking status: Never Smoker   . Smokeless tobacco: Never Used  . Alcohol Use: No      Review of Systems  Constitutional: Negative.   HENT: Negative.   Eyes: Negative.   Respiratory: Negative.   Cardiovascular: Negative.   Gastrointestinal: Positive for heartburn. Negative for nausea, vomiting, abdominal pain, diarrhea, constipation, blood in stool and melena.  Genitourinary: Negative.   Musculoskeletal: Positive for joint pain. Negative for myalgias, back pain, falls and neck pain.       Left knee pain  Skin: Negative.   Neurological: Negative.   Endo/Heme/Allergies: Negative.   Psychiatric/Behavioral: Negative.     Objective:  Physical Exam  Constitutional: She is oriented to person, place, and time. She appears well-developed. No distress.  Overweight  HENT:  Head: Normocephalic and atraumatic.  Right Ear: External ear normal.  Left Ear: External ear normal.  Nose: Nose normal.  Mouth/Throat: Oropharynx is clear and moist.  Eyes: Conjunctivae and EOM are normal.  Neck: Normal range of motion. Neck supple.  Cardiovascular: Normal rate, regular rhythm, normal heart sounds and intact distal pulses.   No murmur heard. Respiratory: Effort normal and breath sounds normal. No respiratory distress. She  has no wheezes.  GI: Soft. Bowel sounds are normal. She exhibits no distension. There is no tenderness.  Musculoskeletal:       Right hip: Normal.       Left hip: Normal.       Right knee: Normal.  The left knee has slight varus. There appears no effusion. Range is 0-130. She is tender along the medial aspect of the knee without lateral tenderness or instability.  Neurological: She is alert and oriented to person, place, and time. She has normal strength and normal  reflexes. No sensory deficit.  Skin: No rash noted. She is not diaphoretic. No erythema.  Psychiatric: She has a normal mood and affect. Her behavior is normal.     Vitals  Weight: 188 lb Height: 66in Body Surface Area: 1.95 m Body Mass Index: 30.34 kg/m  Pulse: 72 (Regular)  BP: 126/78 (Sitting, Left Arm, Standard)  Imaging Review Plain radiographs demonstrate severe degenerative joint disease of the left knee(s). The overall alignment ismild varus. The bone quality appears to be good for age and reported activity level.  Assessment/Plan:  End stage primary osteoarthritis, left knee   The patient history, physical examination, clinical judgment of the provider and imaging studies are consistent with end stage degenerative joint disease of the left knee(s) and partial knee arthroplasty is deemed medically necessary. The treatment options including medical management, injection therapy arthroscopy and arthroplasty were discussed at length. The risks and benefits of partial knee arthroplasty were presented and reviewed. The risks due to aseptic loosening, infection, stiffness, patella tracking problems, thromboembolic complications and other imponderables were discussed. The patient acknowledged the explanation, agreed to proceed with the plan and consent was signed. Patient is being admitted for inpatient treatment for surgery, pain control, PT, OT, prophylactic antibiotics, VTE prophylaxis, progressive ambulation and ADL's and discharge planning. The patient is planning to be discharged home with home health services    Prefers phenergan to zofran PCP: Dr. Gildardo Cranker TXA IV    Dimitri Ped, PA-C

## 2015-07-28 ENCOUNTER — Ambulatory Visit (HOSPITAL_COMMUNITY): Payer: Medicare Other | Admitting: Certified Registered Nurse Anesthetist

## 2015-07-28 ENCOUNTER — Inpatient Hospital Stay (HOSPITAL_COMMUNITY)
Admission: RE | Admit: 2015-07-28 | Discharge: 2015-07-29 | DRG: 470 | Disposition: A | Discharge status: Home Health | Payer: Medicare Other | Source: Ambulatory Visit | Attending: Orthopedic Surgery | Admitting: Orthopedic Surgery

## 2015-07-28 ENCOUNTER — Encounter (HOSPITAL_COMMUNITY): Payer: Self-pay | Admitting: Certified Registered Nurse Anesthetist

## 2015-07-28 ENCOUNTER — Encounter (HOSPITAL_COMMUNITY)
Admission: RE | Disposition: A | Discharge status: Home Health | Payer: Self-pay | Source: Ambulatory Visit | Attending: Orthopedic Surgery

## 2015-07-28 DIAGNOSIS — K219 Gastro-esophageal reflux disease without esophagitis: Secondary | ICD-10-CM | POA: Diagnosis present

## 2015-07-28 DIAGNOSIS — M1712 Unilateral primary osteoarthritis, left knee: Secondary | ICD-10-CM | POA: Diagnosis present

## 2015-07-28 DIAGNOSIS — E669 Obesity, unspecified: Secondary | ICD-10-CM | POA: Diagnosis present

## 2015-07-28 DIAGNOSIS — M1710 Unilateral primary osteoarthritis, unspecified knee: Secondary | ICD-10-CM | POA: Diagnosis present

## 2015-07-28 DIAGNOSIS — F419 Anxiety disorder, unspecified: Secondary | ICD-10-CM | POA: Diagnosis present

## 2015-07-28 DIAGNOSIS — I10 Essential (primary) hypertension: Secondary | ICD-10-CM | POA: Diagnosis present

## 2015-07-28 DIAGNOSIS — Z7982 Long term (current) use of aspirin: Secondary | ICD-10-CM | POA: Diagnosis not present

## 2015-07-28 DIAGNOSIS — M25562 Pain in left knee: Secondary | ICD-10-CM | POA: Diagnosis present

## 2015-07-28 DIAGNOSIS — Z6830 Body mass index (BMI) 30.0-30.9, adult: Secondary | ICD-10-CM | POA: Diagnosis not present

## 2015-07-28 DIAGNOSIS — M179 Osteoarthritis of knee, unspecified: Secondary | ICD-10-CM | POA: Diagnosis present

## 2015-07-28 DIAGNOSIS — Z01812 Encounter for preprocedural laboratory examination: Secondary | ICD-10-CM

## 2015-07-28 DIAGNOSIS — Z79899 Other long term (current) drug therapy: Secondary | ICD-10-CM

## 2015-07-28 HISTORY — PX: PARTIAL KNEE ARTHROPLASTY: SHX2174

## 2015-07-28 SURGERY — ARTHROPLASTY, KNEE, UNICOMPARTMENTAL
Anesthesia: Spinal | Site: Knee | Laterality: Left

## 2015-07-28 MED ORDER — SODIUM CHLORIDE 0.9 % IV SOLN: INTRAVENOUS | Status: DC

## 2015-07-28 MED ORDER — PROPOFOL 10 MG/ML IV BOLUS
INTRAVENOUS | Status: AC
  Filled 2015-07-28: qty 20

## 2015-07-28 MED ORDER — POTASSIUM CHLORIDE IN NACL 20-0.9 MEQ/L-% IV SOLN
INTRAVENOUS | Status: AC
  Filled 2015-07-28: qty 1000

## 2015-07-28 MED ORDER — BUPIVACAINE IN DEXTROSE 0.75-8.25 % IT SOLN
INTRATHECAL | Status: DC | PRN
  Administered 2015-07-28: 1.6 mL via INTRATHECAL

## 2015-07-28 MED ORDER — HYDROMORPHONE HCL 1 MG/ML IJ SOLN: 0.2500 mg | INTRAMUSCULAR | Status: DC | PRN

## 2015-07-28 MED ORDER — PHENYLEPHRINE HCL 10 MG/ML IJ SOLN
INTRAMUSCULAR | Status: DC | PRN
  Administered 2015-07-28 (×4): 40 ug via INTRAVENOUS

## 2015-07-28 MED ORDER — LACTATED RINGERS IV SOLN
INTRAVENOUS | Status: DC
  Administered 2015-07-28: 1000 mL via INTRAVENOUS

## 2015-07-28 MED ORDER — METOCLOPRAMIDE HCL 10 MG PO TABS
5.0000 mg | ORAL_TABLET | Freq: Three times a day (TID) | ORAL | Status: DC | PRN
  Administered 2015-07-28: 10 mg via ORAL
  Filled 2015-07-28: qty 1

## 2015-07-28 MED ORDER — ACETAMINOPHEN 10 MG/ML IV SOLN
1000.0000 mg | Freq: Once | INTRAVENOUS | Status: DC
  Filled 2015-07-28: qty 100

## 2015-07-28 MED ORDER — SODIUM CHLORIDE 0.9 % IV SOLN
1000.0000 mg | INTRAVENOUS | Status: AC
  Administered 2015-07-28: 1000 mg via INTRAVENOUS
  Filled 2015-07-28: qty 10

## 2015-07-28 MED ORDER — MENTHOL 3 MG MT LOZG: 1.0000 | LOZENGE | OROMUCOSAL | Status: DC | PRN

## 2015-07-28 MED ORDER — PHENOL 1.4 % MT LIQD
1.0000 | OROMUCOSAL | Status: DC | PRN
  Filled 2015-07-28: qty 177

## 2015-07-28 MED ORDER — CITALOPRAM HYDROBROMIDE 10 MG PO TABS
10.0000 mg | ORAL_TABLET | Freq: Every day | ORAL | Status: DC
  Administered 2015-07-28: 10 mg via ORAL
  Filled 2015-07-28 (×2): qty 1

## 2015-07-28 MED ORDER — ACETAMINOPHEN 10 MG/ML IV SOLN
INTRAVENOUS | Status: AC
  Filled 2015-07-28: qty 100

## 2015-07-28 MED ORDER — SODIUM CHLORIDE 0.9 % IJ SOLN
INTRAMUSCULAR | Status: AC
  Filled 2015-07-28: qty 10

## 2015-07-28 MED ORDER — MIDAZOLAM HCL 5 MG/5ML IJ SOLN
INTRAMUSCULAR | Status: DC | PRN
  Administered 2015-07-28 (×2): 1 mg via INTRAVENOUS

## 2015-07-28 MED ORDER — DEXAMETHASONE SODIUM PHOSPHATE 10 MG/ML IJ SOLN
10.0000 mg | Freq: Once | INTRAMUSCULAR | Status: AC
  Administered 2015-07-29: 10 mg via INTRAVENOUS
  Filled 2015-07-28: qty 1

## 2015-07-28 MED ORDER — DEXAMETHASONE SODIUM PHOSPHATE 10 MG/ML IJ SOLN: 10.0000 mg | Freq: Once | INTRAMUSCULAR | Status: DC

## 2015-07-28 MED ORDER — BUPIVACAINE LIPOSOME 1.3 % IJ SUSP
20.0000 mL | Freq: Once | INTRAMUSCULAR | Status: DC
  Filled 2015-07-28: qty 20

## 2015-07-28 MED ORDER — DEXAMETHASONE SODIUM PHOSPHATE 4 MG/ML IJ SOLN
INTRAMUSCULAR | Status: DC | PRN
  Administered 2015-07-28: 10 mg via INTRAVENOUS

## 2015-07-28 MED ORDER — BUPIVACAINE HCL (PF) 0.25 % IJ SOLN
INTRAMUSCULAR | Status: DC | PRN
Start: 2015-07-28 — End: 2015-07-28
  Administered 2015-07-28: 20 mL

## 2015-07-28 MED ORDER — ACETAMINOPHEN 325 MG PO TABS
650.0000 mg | ORAL_TABLET | Freq: Four times a day (QID) | ORAL | Status: DC | PRN
Start: 2015-07-29 — End: 2015-07-29

## 2015-07-28 MED ORDER — ACETAMINOPHEN 650 MG RE SUPP: 650.0000 mg | Freq: Four times a day (QID) | RECTAL | Status: DC | PRN

## 2015-07-28 MED ORDER — DIPHENHYDRAMINE HCL 12.5 MG/5ML PO ELIX: 12.5000 mg | ORAL_SOLUTION | ORAL | Status: DC | PRN

## 2015-07-28 MED ORDER — RIVAROXABAN 10 MG PO TABS
10.0000 mg | ORAL_TABLET | Freq: Every day | ORAL | Status: DC
  Administered 2015-07-29: 10 mg via ORAL
  Filled 2015-07-28 (×2): qty 1

## 2015-07-28 MED ORDER — ACETAMINOPHEN 10 MG/ML IV SOLN
INTRAVENOUS | Status: DC | PRN
Start: 2015-07-28 — End: 2015-07-28
  Administered 2015-07-28: 1000 mg via INTRAVENOUS

## 2015-07-28 MED ORDER — ONDANSETRON HCL 4 MG/2ML IJ SOLN
INTRAMUSCULAR | Status: AC
  Filled 2015-07-28: qty 2

## 2015-07-28 MED ORDER — PROPOFOL INFUSION 10 MG/ML OPTIME
INTRAVENOUS | Status: DC | PRN
  Administered 2015-07-28: 100 ug/kg/min via INTRAVENOUS

## 2015-07-28 MED ORDER — ONDANSETRON HCL 4 MG/2ML IJ SOLN
INTRAMUSCULAR | Status: DC | PRN
  Administered 2015-07-28 (×4): 1 mg via INTRAVENOUS

## 2015-07-28 MED ORDER — HYDROCHLOROTHIAZIDE 25 MG PO TABS
25.0000 mg | ORAL_TABLET | Freq: Every day | ORAL | Status: DC
  Filled 2015-07-28: qty 1

## 2015-07-28 MED ORDER — CEFAZOLIN SODIUM-DEXTROSE 2-3 GM-% IV SOLR
INTRAVENOUS | Status: AC
  Filled 2015-07-28: qty 50

## 2015-07-28 MED ORDER — DEXAMETHASONE SODIUM PHOSPHATE 10 MG/ML IJ SOLN
INTRAMUSCULAR | Status: AC
  Filled 2015-07-28: qty 1

## 2015-07-28 MED ORDER — SODIUM CHLORIDE 0.9 % IR SOLN
Status: DC | PRN
  Administered 2015-07-28: 1000 mL

## 2015-07-28 MED ORDER — METHOCARBAMOL 1000 MG/10ML IJ SOLN
500.0000 mg | Freq: Four times a day (QID) | INTRAVENOUS | Status: DC | PRN
  Administered 2015-07-28: 500 mg via INTRAVENOUS
  Filled 2015-07-28 (×2): qty 5

## 2015-07-28 MED ORDER — ONDANSETRON HCL 4 MG/2ML IJ SOLN: 4.0000 mg | Freq: Four times a day (QID) | INTRAMUSCULAR | Status: DC | PRN

## 2015-07-28 MED ORDER — FENTANYL CITRATE (PF) 100 MCG/2ML IJ SOLN
INTRAMUSCULAR | Status: DC | PRN
  Administered 2015-07-28 (×2): 50 ug via INTRAVENOUS

## 2015-07-28 MED ORDER — MIDAZOLAM HCL 2 MG/2ML IJ SOLN
INTRAMUSCULAR | Status: AC
  Filled 2015-07-28: qty 4

## 2015-07-28 MED ORDER — SODIUM CHLORIDE 0.9 % IJ SOLN
INTRAMUSCULAR | Status: AC
  Filled 2015-07-28: qty 50

## 2015-07-28 MED ORDER — MORPHINE SULFATE (PF) 2 MG/ML IV SOLN: 1.0000 mg | INTRAVENOUS | Status: DC | PRN

## 2015-07-28 MED ORDER — CEFAZOLIN SODIUM-DEXTROSE 2-3 GM-% IV SOLR
2.0000 g | INTRAVENOUS | Status: AC
  Administered 2015-07-28: 2 g via INTRAVENOUS

## 2015-07-28 MED ORDER — ONDANSETRON HCL 4 MG PO TABS
4.0000 mg | ORAL_TABLET | Freq: Four times a day (QID) | ORAL | Status: DC | PRN
  Administered 2015-07-28: 4 mg via ORAL
  Filled 2015-07-28: qty 1

## 2015-07-28 MED ORDER — EPHEDRINE SULFATE 50 MG/ML IJ SOLN
INTRAMUSCULAR | Status: AC
  Filled 2015-07-28: qty 1

## 2015-07-28 MED ORDER — BUPIVACAINE LIPOSOME 1.3 % IJ SUSP
INTRAMUSCULAR | Status: DC | PRN
  Administered 2015-07-28: 20 mL

## 2015-07-28 MED ORDER — METHOCARBAMOL 500 MG PO TABS
500.0000 mg | ORAL_TABLET | Freq: Four times a day (QID) | ORAL | Status: DC | PRN
  Administered 2015-07-29: 500 mg via ORAL
  Filled 2015-07-28: qty 1

## 2015-07-28 MED ORDER — METOCLOPRAMIDE HCL 5 MG/ML IJ SOLN: 5.0000 mg | Freq: Three times a day (TID) | INTRAMUSCULAR | Status: DC | PRN

## 2015-07-28 MED ORDER — KETOROLAC TROMETHAMINE 15 MG/ML IJ SOLN
7.5000 mg | Freq: Four times a day (QID) | INTRAMUSCULAR | Status: DC | PRN
  Administered 2015-07-28: 7.5 mg via INTRAVENOUS
  Filled 2015-07-28: qty 1

## 2015-07-28 MED ORDER — POLYETHYLENE GLYCOL 3350 17 G PO PACK: 17.0000 g | PACK | Freq: Every day | ORAL | Status: DC | PRN

## 2015-07-28 MED ORDER — CHLORHEXIDINE GLUCONATE 4 % EX LIQD: 60.0000 mL | Freq: Once | CUTANEOUS | Status: DC

## 2015-07-28 MED ORDER — POTASSIUM CHLORIDE IN NACL 20-0.9 MEQ/L-% IV SOLN
INTRAVENOUS | Status: DC
  Administered 2015-07-28 – 2015-07-29 (×2): via INTRAVENOUS
  Filled 2015-07-28 (×3): qty 1000

## 2015-07-28 MED ORDER — FLEET ENEMA 7-19 GM/118ML RE ENEM
1.0000 | ENEMA | Freq: Once | RECTAL | Status: DC | PRN
Start: 2015-07-28 — End: 2015-07-29

## 2015-07-28 MED ORDER — LOSARTAN POTASSIUM 50 MG PO TABS
100.0000 mg | ORAL_TABLET | Freq: Every day | ORAL | Status: DC
  Filled 2015-07-28: qty 2

## 2015-07-28 MED ORDER — OXYCODONE HCL 5 MG PO TABS
5.0000 mg | ORAL_TABLET | ORAL | Status: DC | PRN
  Administered 2015-07-28 – 2015-07-29 (×5): 5 mg via ORAL
  Administered 2015-07-29: 10 mg via ORAL
  Filled 2015-07-28: qty 2
  Filled 2015-07-28 (×5): qty 1

## 2015-07-28 MED ORDER — FENTANYL CITRATE (PF) 100 MCG/2ML IJ SOLN
INTRAMUSCULAR | Status: AC
  Filled 2015-07-28: qty 4

## 2015-07-28 MED ORDER — ACETAMINOPHEN 500 MG PO TABS
1000.0000 mg | ORAL_TABLET | Freq: Four times a day (QID) | ORAL | Status: AC
  Administered 2015-07-28 – 2015-07-29 (×4): 1000 mg via ORAL
  Filled 2015-07-28 (×5): qty 2

## 2015-07-28 MED ORDER — LACTATED RINGERS IV SOLN
INTRAVENOUS | Status: DC | PRN
  Administered 2015-07-28 (×2): via INTRAVENOUS

## 2015-07-28 MED ORDER — CEFAZOLIN SODIUM-DEXTROSE 2-3 GM-% IV SOLR
2.0000 g | Freq: Four times a day (QID) | INTRAVENOUS | Status: AC
  Administered 2015-07-28 (×2): 2 g via INTRAVENOUS
  Filled 2015-07-28 (×2): qty 50

## 2015-07-28 MED ORDER — SODIUM CHLORIDE 0.9 % IJ SOLN
INTRAMUSCULAR | Status: DC | PRN
  Administered 2015-07-28: 30 mL

## 2015-07-28 MED ORDER — DOCUSATE SODIUM 100 MG PO CAPS
100.0000 mg | ORAL_CAPSULE | Freq: Two times a day (BID) | ORAL | Status: DC
  Administered 2015-07-28 – 2015-07-29 (×2): 100 mg via ORAL

## 2015-07-28 MED ORDER — LOSARTAN POTASSIUM-HCTZ 100-25 MG PO TABS: 1.0000 | ORAL_TABLET | Freq: Every morning | ORAL | Status: DC

## 2015-07-28 MED ORDER — EPHEDRINE SULFATE 50 MG/ML IJ SOLN
INTRAMUSCULAR | Status: DC | PRN
  Administered 2015-07-28 (×3): 5 mg via INTRAVENOUS

## 2015-07-28 MED ORDER — BUPIVACAINE HCL (PF) 0.25 % IJ SOLN
INTRAMUSCULAR | Status: AC
  Filled 2015-07-28: qty 30

## 2015-07-28 MED ORDER — PROMETHAZINE HCL 25 MG/ML IJ SOLN: 6.2500 mg | INTRAMUSCULAR | Status: DC | PRN

## 2015-07-28 MED ORDER — BISACODYL 10 MG RE SUPP: 10.0000 mg | Freq: Every day | RECTAL | Status: DC | PRN

## 2015-07-28 SURGICAL SUPPLY — 59 items
BAG DECANTER FOR FLEXI CONT (MISCELLANEOUS) ×3 IMPLANT
BAG SPEC THK2 15X12 ZIP CLS (MISCELLANEOUS) ×1
BAG ZIPLOCK 12X15 (MISCELLANEOUS) ×2 IMPLANT
BANDAGE ELASTIC 6 VELCRO ST LF (GAUZE/BANDAGES/DRESSINGS) ×3 IMPLANT
BANDAGE ESMARK 6X9 LF (GAUZE/BANDAGES/DRESSINGS) ×1 IMPLANT
BLADE SAW RECIPROCATING 77.5 (BLADE) ×3 IMPLANT
BLADE SAW SGTL 13.0X1.19X90.0M (BLADE) ×3 IMPLANT
BNDG CMPR 9X6 STRL LF SNTH (GAUZE/BANDAGES/DRESSINGS) ×1
BNDG ESMARK 6X9 LF (GAUZE/BANDAGES/DRESSINGS) ×3
BOWL SMART MIX CTS (DISPOSABLE) ×5 IMPLANT
BUR OVAL CARBIDE 4.0 (BURR) ×3 IMPLANT
CAPT KNEE PARTIAL 2 ×3 IMPLANT
CEMENT HV SMART SET (Cement) ×3 IMPLANT
CLOSURE WOUND 1/2 X4 (GAUZE/BANDAGES/DRESSINGS) ×1
CUFF TOURN SGL QUICK 34 (TOURNIQUET CUFF) ×3
CUFF TRNQT CYL 34X4X40X1 (TOURNIQUET CUFF) ×1 IMPLANT
DRAPE EXTREMITY T 121X128X90 (DRAPE) ×3 IMPLANT
DRAPE POUCH INSTRU U-SHP 10X18 (DRAPES) ×3 IMPLANT
DRSG ADAPTIC 3X8 NADH LF (GAUZE/BANDAGES/DRESSINGS) ×3 IMPLANT
DRSG PAD ABDOMINAL 8X10 ST (GAUZE/BANDAGES/DRESSINGS) ×1 IMPLANT
DURAPREP 26ML APPLICATOR (WOUND CARE) ×3 IMPLANT
ELECT REM PT RETURN 9FT ADLT (ELECTROSURGICAL) ×3
ELECTRODE REM PT RTRN 9FT ADLT (ELECTROSURGICAL) ×1 IMPLANT
EVACUATOR 1/8 PVC DRAIN (DRAIN) ×3 IMPLANT
FACESHIELD WRAPAROUND (MASK) ×15 IMPLANT
FACESHIELD WRAPAROUND OR TEAM (MASK) ×5 IMPLANT
GAUZE SPONGE 4X4 12PLY STRL (GAUZE/BANDAGES/DRESSINGS) ×3 IMPLANT
GLOVE BIO SURGEON STRL SZ7.5 (GLOVE) ×5 IMPLANT
GLOVE BIO SURGEON STRL SZ8 (GLOVE) ×3 IMPLANT
GLOVE BIOGEL PI IND STRL 8 (GLOVE) ×2 IMPLANT
GLOVE BIOGEL PI INDICATOR 8 (GLOVE) ×4
GOWN STRL REUS W/TWL LRG LVL3 (GOWN DISPOSABLE) ×3 IMPLANT
GOWN STRL REUS W/TWL XL LVL3 (GOWN DISPOSABLE) ×3 IMPLANT
HANDPIECE INTERPULSE COAX TIP (DISPOSABLE) ×3
IMMOBILIZER KNEE 20 (SOFTGOODS) ×2 IMPLANT
IMMOBILIZER KNEE 20 THIGH 36 (SOFTGOODS) ×1 IMPLANT
KIT BASIN OR (CUSTOM PROCEDURE TRAY) ×3 IMPLANT
KIT IMPL STRL TIB IPOLY IUNI IMPLANT
MANIFOLD NEPTUNE II (INSTRUMENTS) ×3 IMPLANT
NDL SAFETY ECLIPSE 18X1.5 (NEEDLE) ×2 IMPLANT
NEEDLE HYPO 18GX1.5 SHARP (NEEDLE) ×6
PACK TOTAL JOINT (CUSTOM PROCEDURE TRAY) ×3 IMPLANT
PAD ABD 8X10 STRL (GAUZE/BANDAGES/DRESSINGS) ×2 IMPLANT
PADDING CAST COTTON 6X4 STRL (CAST SUPPLIES) ×4 IMPLANT
POSITIONER SURGICAL ARM (MISCELLANEOUS) ×3 IMPLANT
SET HNDPC FAN SPRY TIP SCT (DISPOSABLE) ×1 IMPLANT
STRIP CLOSURE SKIN 1/2X4 (GAUZE/BANDAGES/DRESSINGS) ×3 IMPLANT
SUCTION FRAZIER 12FR DISP (SUCTIONS) ×3 IMPLANT
SUT MNCRL AB 4-0 PS2 18 (SUTURE) ×3 IMPLANT
SUT VIC AB 2-0 CT1 27 (SUTURE) ×6
SUT VIC AB 2-0 CT1 TAPERPNT 27 (SUTURE) ×2 IMPLANT
SUT VLOC 180 0 24IN GS25 (SUTURE) ×3 IMPLANT
SYR 20CC LL (SYRINGE) ×3 IMPLANT
SYR 50ML LL SCALE MARK (SYRINGE) ×3 IMPLANT
TOWEL OR 17X26 10 PK STRL BLUE (TOWEL DISPOSABLE) ×3 IMPLANT
TOWEL OR NON WOVEN STRL DISP B (DISPOSABLE) ×3 IMPLANT
TRAY FOLEY W/METER SILVER 14FR (SET/KITS/TRAYS/PACK) ×1 IMPLANT
TRAY FOLEY W/METER SILVER 16FR (SET/KITS/TRAYS/PACK) ×1 IMPLANT
WRAP KNEE MAXI GEL POST OP (GAUZE/BANDAGES/DRESSINGS) ×2 IMPLANT

## 2015-07-28 NOTE — Evaluation (Signed)
Physical Therapy Evaluation Patient Details Name: [facility] MRN: [id number] DOB: 01-15-1951 Today's Date: 07/28/2015   History of Present Illness  Pt is a 64 year old female s/p L medial unicompartmental knee arthroscopy.  Clinical Impression  Patient is s/p above surgery resulting in functional limitations due to the deficits listed below (see PT Problem List).  Patient will benefit from skilled PT to increase their independence and safety with mobility to allow discharge to the venue listed below.       Follow Up Recommendations Home health PT    Equipment Recommendations  None recommended by PT    Recommendations for Other Services       Precautions / Restrictions Precautions Precautions: Knee Required Braces or Orthoses: Knee Immobilizer - Left Knee Immobilizer - Left: Discontinue once straight leg raise with < 10 degree lag Restrictions Other Position/Activity Restrictions: WBAT      Mobility  Bed Mobility Overal bed mobility: Needs Assistance Bed Mobility: Supine to Sit     Supine to sit: Supervision        Transfers Overall transfer level: Needs assistance Equipment used: Rolling walker (2 wheeled) Transfers: Sit to/from Stand Sit to Stand: Min guard         General transfer comment: verbal cues for technique including UE and LE positioning  Ambulation/Gait Ambulation/Gait assistance: Min guard Ambulation Distance (Feet): 40 Feet Assistive device: Rolling walker (2 wheeled) Gait Pattern/deviations: Step-to pattern;Antalgic     General Gait Details: verbal cues for safe technique, sequence, RW positioning  Stairs            Wheelchair Mobility    Modified Rankin (Stroke Patients Only)       Balance                                             Pertinent Vitals/Pain Pain Assessment: 0-10 Pain Score: 7  Pain Location: L knee Pain Descriptors / Indicators: Aching Pain Intervention(s): Monitored during  session;Limited activity within patient's tolerance;Repositioned;Patient requesting pain meds-RN notified    Home Living Family/patient expects to be discharged to:: Private residence Living Arrangements: Spouse/significant other   Type of Home: House Home Access: Stairs to enter Entrance Stairs-Rails: Left Entrance Stairs-Number of Steps: 4 Home Layout: One level Home Equipment: Environmental consultant - 2 wheels;Crutches      Prior Function Level of Independence: Independent               Hand Dominance        Extremity/Trunk Assessment               Lower Extremity Assessment: LLE deficits/detail   LLE Deficits / Details: able to perform SLR     Communication   Communication: No difficulties  Cognition Arousal/Alertness: Awake/alert Behavior During Therapy: WFL for tasks assessed/performed Overall Cognitive Status: Within Functional Limits for tasks assessed                      General Comments      Exercises        Assessment/Plan    PT Assessment Patient needs continued PT services  PT Diagnosis Difficulty walking;Acute pain   PT Problem List Decreased strength;Decreased range of motion;Decreased mobility;Pain  PT Treatment Interventions Gait training;DME instruction;Stair training;Patient/family education;Therapeutic activities;Therapeutic exercise;Functional mobility training   PT Goals (Current goals can be found in the Care Plan section) Acute Rehab  PT Goals PT Goal Formulation: With patient Time For Goal Achievement: 07/31/15 Potential to Achieve Goals: Good    Frequency 7X/week   Barriers to discharge        Co-evaluation               End of Session Equipment Utilized During Treatment: Gait belt;Left knee immobilizer Activity Tolerance: Patient tolerated treatment well Patient left: in chair;with call bell/phone within reach;with family/visitor present Nurse Communication: Mobility status         Time: 1610-9604 PT Time  Calculation (min) (ACUTE ONLY): 22 min   Charges:   PT Evaluation $Initial PT Evaluation Tier I: 1 Procedure     PT G Codes:        Ledon Weihe,KATHrine E 07/28/2015, 5:17 PM Zenovia Jarred, PT, DPT 07/28/2015 Pager: [phone]

## 2015-07-28 NOTE — Interval H&P Note (Signed)
History and Physical Interval Note:  07/28/2015 6:58 AM  [facility]  has presented today for surgery, with the diagnosis of medial compartmental left knee osteoarthritis  The various methods of treatment have been discussed with the patient and family. After consideration of risks, benefits and other options for treatment, the patient has consented to  Procedure(s): LEFT KNEE MEDIAL UNICOMPARTMENTAL ARTHROPLASTY (Left) as a surgical intervention .  The patient's history has been reviewed, patient examined, no change in status, stable for surgery.  I have reviewed the patient's chart and labs.  Questions were answered to the patient's satisfaction.     Loanne Drilling

## 2015-07-28 NOTE — Transfer of Care (Signed)
Immediate Anesthesia Transfer of Care Note  Patient: [facility]  Procedure(s) Performed: Procedure(s): LEFT KNEE MEDIAL UNICOMPARTMENTAL ARTHROPLASTY (Left)  Patient Location: PACU  Anesthesia Type:Spinal  Level of Consciousness: awake, alert , oriented and patient cooperative  Airway & Oxygen Therapy: Patient Spontanous Breathing and Patient connected to face mask oxygen  Post-op Assessment: Report given to RN, Post -op Vital signs reviewed and stable and Patient moving all extremities  Post vital signs: Reviewed and stable  Last Vitals:  Filed Vitals:   07/28/15 0557  BP: 131/74  Pulse: 84  Temp: 36.8 C  Resp: 18    Complications: No apparent anesthesia complications

## 2015-07-28 NOTE — Anesthesia Preprocedure Evaluation (Addendum)
Anesthesia Evaluation  Patient identified by MRN, date of birth, ID band Patient awake    Reviewed: Allergy & Precautions, NPO status , Patient's Chart, lab work & pertinent test results  History of Anesthesia Complications (+) PONV and history of anesthetic complications  Airway Mallampati: II  TM Distance: >3 FB Neck ROM: Full    Dental no notable dental hx.    Pulmonary neg pulmonary ROS,  breath sounds clear to auscultation  Pulmonary exam normal       Cardiovascular Exercise Tolerance: Good hypertension, Pt. on medications Normal cardiovascular examRhythm:Regular Rate:Normal     Neuro/Psych Anxiety  Neuromuscular disease    GI/Hepatic Neg liver ROS, GERD-  ,  Endo/Other  negative endocrine ROS  Renal/GU negative Renal ROS  negative genitourinary   Musculoskeletal  (+) Arthritis -, Osteoarthritis,    Abdominal (+) + obese,   Peds negative pediatric ROS (+)  Hematology negative hematology ROS (+)   Anesthesia Other Findings   Reproductive/Obstetrics negative OB ROS                            Anesthesia Physical Anesthesia Plan  ASA: II  Anesthesia Plan: Spinal   Post-op Pain Management:    Induction: Intravenous  Airway Management Planned: Natural Airway  Additional Equipment:   Intra-op Plan:   Post-operative Plan: Extubation in OR  Informed Consent: I have reviewed the patients History and Physical, chart, labs and discussed the procedure including the risks, benefits and alternatives for the proposed anesthesia with the patient or authorized representative who has indicated his/her understanding and acceptance.   Dental advisory given  Plan Discussed with: CRNA  Anesthesia Plan Comments: (Discussed risks and benefits of and differences between spinal and general. Discussed risks of spinal including headache, backache, failure, bleeding and hematoma, infection, and  nerve damage and paralysis. Patient consents to spinal. Questions answered. Coagulation studies and platelet count acceptable. )       Anesthesia Quick Evaluation

## 2015-07-28 NOTE — Anesthesia Procedure Notes (Signed)
Spinal Patient location during procedure: OR Start time: 07/28/2015 8:25 AM End time: 07/28/2015 8:31 AM Staffing Resident/CRNA: Dion Saucier E Performed by: resident/CRNA  Preanesthetic Checklist Completed: patient identified, site marked, surgical consent, pre-op evaluation, timeout performed, IV checked, risks and benefits discussed and monitors and equipment checked Spinal Block Patient position: sitting Prep: Betadine, site prepped and draped and alcohol swabs Patient monitoring: heart rate, continuous pulse ox and blood pressure Approach: midline Location: L3-4 Injection technique: single-shot Needle Needle type: Sprotte  Needle gauge: 22 G Needle length: 9 cm Additional Notes Kit expiration date checked. Negative heme, paresthesia, good flow. Tolerated well.

## 2015-07-28 NOTE — Op Note (Signed)
OPERATIVE REPORT  PREOPERATIVE DIAGNOSIS: Medial compartment osteoarthritis, Left knee  POSTOPERATIVE DIAGNOSIS: Medial compartment osteoarthritis, Left knee  PROCEDURE:Left knee medial unicompartmental arthroplasty.   SURGEON: Ollen Gross, MD   ASSISTANT: Avel Peace, PA-C  ANESTHESIA:  Spinal.   ESTIMATED BLOOD LOSS: Minimal.   DRAINS: Hemovac x1.   TOURNIQUET TIME: 26 minutes at 300 mmHg.   COMPLICATIONS: None.   CONDITION: Stable to recovery.   BRIEF CLINICAL NOTE:Courtney Hurley is a 64 y.o. female, who has  significant isolated medial compartment arthritis of the Left knee. She has had nonoperative management including injections. She has had  cortisone and viscous supplements. Unfortunately, the pain persists.  Radiograph showed isolated medial compartment bone-on-bone arthritis  with normal-appearing patellofemoral and lateral compartments. She  presents now for left knee unicompartmental arthroplasty.   PROCEDURE IN DETAIL: After successful administration of  Spinal anesthetic, a tourniquet was placed high on the  Left thigh and left lower extremity prepped and draped in usual sterile fashion. Extremity was wrapped in an Esmarch, knee flexed, and tourniquet inflated to 300 mmHg. A midline incision was made with a 10 blade through subcutaneous  tissue to the extensor mechanism. A fresh blade was used to make a  medial parapatellar arthrotomy. Soft tissue on the proximal medial  tibia subperiosteally elevated to the joint line with a knife and into  the semimembranosus bursa with a Cobb elevator. The patella was  subluxed laterally, and the knee flexed 90 degrees. The ACL was intact.  The marginal osteophytes on the medial femur and tibia were removed with  a rongeur. The medial meniscus was also removed. The femoral cutting  block where the conformis unicompartmental knee system was placed along  the femur. There was excellent fit. I traced the  outline. We then  removed any remaining cartilage within this outline. We then placed the  cutting block again and pinned in position. The posterior femoral cut  was made, it was approximately 5 mm. The lug holes for the femoral  component were then drilled through the cutting block. The cutting  block was subsequently removed. We then utilized the high speed burr to  create a small trough at the superior aspect of the components that of  the inset and would not overhang the cartilage. The trial was placed,  it had excellent fit. The trial was subsequently removed.       The trial was placed again and the B chip was placed. There was  excellent balance throughout full motion. Also with excellent fit on  her tibia. This was removed as was the femoral trial. A curette was  used to remove any remaining cartilage from the tibia. The tibial  cutting block was then placed and there was a perfect fit on the tibial  surface. The appropriate slope was placed and it was pinned in  position. The reciprocating saw was used to make the central cut and  then the oscillating saw used to make the horizontal cut. The bone  fragment was then removed. The tibial trial was placed and had perfect  fit on the tibia. We then drilled the 2 lug holes and did the keel punch.  We then placed tibia trial femur, and a 6 mm trial insert. There was  excellent stability throughout full range of motion and no impingement.  The trial was then removed. We drilled small holes in the distal  femur in order to create more conduits for the cement. The cut bone  surfaces were thoroughly  irrigated with pulsatile lavage while the  cement was mixed on the back table. We then cemented the tibial  component into place, impacted it and removed the extruded cement. The  same was done for the femoral component. Trial 6-mm inserts placed,  knee held in full extension, and all extruded cement removed. While the  cement was hardening, I  injected the extensor mechanism, periosteum of  the femur and subcu tissues, a total of 20 mL of Exparel mixed with 30  mL of saline and then did an additional injection of 20 mL of 0.25%  Marcaine into the same tissues. When the cement had fully hardened,  then the permanent polyethylene was placed in tibial tray. There was  excellent stability throughout full range of motion with no lift off the  component and no evidence of any impingement. Wound was copiously  irrigated with saline solution, and the arthrotomy closed over a Hemovac  drain with a running #1 V-Loc suture. The subcutaneous was closed with  interrupted 2-0 Vicryl and subcuticular running 4-0 Monocryl. The drain  was hooked to suction. Incision cleaned and dried and Steri-Strips and  a bulky sterile dressing applied. The tourniquet was released after a  total time of 26 minutes. This was done after closing the extensor  mechanism. The wound was closed and a bulky sterile dressing was  applied. She was placed into a knee immobilizer, awakened and  transported to recovery room in stable condition.  Please note that a surgical assistant was a medical necessity for this  procedure in order to perform it in a safe and expeditious manner.  Assistance was necessary for retracting vital ligaments, neurovascular  structures, as well as for proper positioning of the limb to allow for  appropriate bone cuts and appropriate placement of the prosthesis.    Gus Rankin Jeffrie Lofstrom, MD

## 2015-07-28 NOTE — Anesthesia Postprocedure Evaluation (Signed)
  Anesthesia Post-op Note  Patient: Courtney Hurley  Procedure(s) Performed: Procedure(s) (LRB): LEFT KNEE MEDIAL UNICOMPARTMENTAL ARTHROPLASTY (Left)  Patient Location: PACU  Anesthesia Type: Spinal  Level of Consciousness: awake and alert   Airway and Oxygen Therapy: Patient Spontanous Breathing  Post-op Pain: mild  Post-op Assessment: Post-op Vital signs reviewed, Patient's Cardiovascular Status Stable, Respiratory Function Stable, Patent Airway and No signs of Nausea or vomiting. Spinal is receding.  Last Vitals:  Filed Vitals:   07/28/15 1212  BP: 110/66  Pulse: 66  Temp: 36.3 C  Resp: 16    Post-op Vital Signs: stable   Complications: No apparent anesthesia complications

## 2015-07-29 ENCOUNTER — Encounter (HOSPITAL_COMMUNITY): Payer: Self-pay | Admitting: Orthopedic Surgery

## 2015-07-29 LAB — CBC
HCT: 33.7 % — ABNORMAL LOW (ref 36.0–46.0)
Hemoglobin: 10.9 g/dL — ABNORMAL LOW (ref 12.0–15.0)
MCH: 26.7 pg (ref 26.0–34.0)
MCHC: 32.3 g/dL (ref 30.0–36.0)
MCV: 82.4 fL (ref 78.0–100.0)
PLATELETS: 221 10*3/uL (ref 150–400)
RBC: 4.09 MIL/uL (ref 3.87–5.11)
RDW: 13.9 % (ref 11.5–15.5)
WBC: 13.8 10*3/uL — ABNORMAL HIGH (ref 4.0–10.5)

## 2015-07-29 LAB — BASIC METABOLIC PANEL
Anion gap: 8 (ref 5–15)
BUN: 15 mg/dL (ref 6–20)
CALCIUM: 8.9 mg/dL (ref 8.9–10.3)
CO2: 26 mmol/L (ref 22–32)
CREATININE: 0.74 mg/dL (ref 0.44–1.00)
Chloride: 106 mmol/L (ref 101–111)
Glucose, Bld: 125 mg/dL — ABNORMAL HIGH (ref 65–99)
Potassium: 3.8 mmol/L (ref 3.5–5.1)
SODIUM: 140 mmol/L (ref 135–145)

## 2015-07-29 MED ORDER — OXYCODONE HCL 5 MG PO TABS: 5.0000 mg | ORAL_TABLET | ORAL | Status: AC | PRN

## 2015-07-29 MED ORDER — ONDANSETRON HCL 4 MG PO TABS: 4.0000 mg | ORAL_TABLET | Freq: Four times a day (QID) | ORAL | Status: AC | PRN

## 2015-07-29 MED ORDER — RIVAROXABAN 10 MG PO TABS: 10.0000 mg | ORAL_TABLET | Freq: Every day | ORAL | Status: AC

## 2015-07-29 MED ORDER — METHOCARBAMOL 500 MG PO TABS: 500.0000 mg | ORAL_TABLET | Freq: Four times a day (QID) | ORAL | Status: AC | PRN

## 2015-07-29 NOTE — Progress Notes (Signed)
Pt d/c home with [facility]. Potty chair delivered to patient's room prior to d/c. Patient d/c on Xarelto for her blood thinner. Prior insurance authorization did not occur, so instructions for patient to take 325 mg of ASA BID until Xarelto approved by insurance was given to patient and husband, per Avel Peace, PA  AVS reviewed and "My Chart" discussed with pt. Pt capable of verbalizing medications, dressing changes, signs and symptoms of infection, and follow-up appointments. Remains hemodynamically stable. No signs and symptoms of distress. Educated pt to return to ER in the case of SOB, dizziness, or chest pain.

## 2015-07-29 NOTE — Discharge Summary (Signed)
Physician Discharge Summary   Patient ID: Nanna Ertle MRN: [id number] DOB/AGE: [birth year] 65 y.o.  Admit date: 07/28/2015 Discharge date:  07/29/2015 Primary Diagnosis:  Medial compartment osteoarthritis, Left knee Admission Diagnoses:  Past Medical History  Diagnosis Date  . Hypertension   . PONV (postoperative nausea and vomiting)   . Anxiety   . GERD (gastroesophageal reflux disease)     hx of   . Esophageal spasm   . Arthritis    Discharge Diagnoses:   Principal Problem:   OA (osteoarthritis) of knee  Estimated body mass index is 30.84 kg/(m^2) as calculated from the following:   Height as of this encounter: 5' 6"  (1.676 m).   Weight as of this encounter: 86.637 kg (191 lb).  Procedure:  Procedure(s) (LRB): LEFT KNEE MEDIAL UNICOMPARTMENTAL ARTHROPLASTY (Left)   Consults: None  HPI: Pheonix Wisby is a 64 y.o. female, who has  significant isolated medial compartment arthritis of the Left knee. She has had nonoperative management including injections. She has had  cortisone and viscous supplements. Unfortunately, the pain persists.  Radiograph showed isolated medial compartment bone-on-bone arthritis  with normal-appearing patellofemoral and lateral compartments. She  presents now for left knee unicompartmental arthroplasty.   Laboratory Data: Admission on 07/28/2015  Component Date Value Ref Range Status  . WBC 07/29/2015 13.8* 4.0 - 10.5 K/uL Final  . RBC 07/29/2015 4.09  3.87 - 5.11 MIL/uL Final  . Hemoglobin 07/29/2015 10.9* 12.0 - 15.0 g/dL Final  . HCT 07/29/2015 33.7* 36.0 - 46.0 % Final  . MCV 07/29/2015 82.4  78.0 - 100.0 fL Final  . MCH 07/29/2015 26.7  26.0 - 34.0 pg Final  . MCHC 07/29/2015 32.3  30.0 - 36.0 g/dL Final  . RDW 07/29/2015 13.9  11.5 - 15.5 % Final  . Platelets 07/29/2015 221  150 - 400 K/uL Final  . Sodium 07/29/2015 140  135 - 145 mmol/L Final  . Potassium 07/29/2015 3.8  3.5 - 5.1 mmol/L Final  . Chloride 07/29/2015 106   101 - 111 mmol/L Final  . CO2 07/29/2015 26  22 - 32 mmol/L Final  . Glucose, Bld 07/29/2015 125* 65 - 99 mg/dL Final  . BUN 07/29/2015 15  6 - 20 mg/dL Final  . Creatinine, Ser 07/29/2015 0.74  0.44 - 1.00 mg/dL Final  . Calcium 07/29/2015 8.9  8.9 - 10.3 mg/dL Final  . GFR calc non Af Amer 07/29/2015 >60  >60 mL/min Final  . GFR calc Af Amer 07/29/2015 >60  >60 mL/min Final   Comment: (NOTE) The eGFR has been calculated using the CKD EPI equation. This calculation has not been validated in all clinical situations. eGFR's persistently <60 mL/min signify possible Chronic Kidney Disease.   Georgiann Hahn gap 07/29/2015 8  5 - 15 Final  Hospital Outpatient Visit on 07/16/2015  Component Date Value Ref Range Status  . aPTT 07/16/2015 36  24 - 37 seconds Final  . WBC 07/16/2015 6.1  4.0 - 10.5 K/uL Final  . RBC 07/16/2015 4.74  3.87 - 5.11 MIL/uL Final  . Hemoglobin 07/16/2015 12.8  12.0 - 15.0 g/dL Final  . HCT 07/16/2015 38.5  36.0 - 46.0 % Final  . MCV 07/16/2015 81.2  78.0 - 100.0 fL Final  . MCH 07/16/2015 27.0  26.0 - 34.0 pg Final  . MCHC 07/16/2015 33.2  30.0 - 36.0 g/dL Final  . RDW 07/16/2015 13.5  11.5 - 15.5 % Final  . Platelets 07/16/2015 233  150 - 400 K/uL Final  .  Sodium 07/16/2015 137  135 - 145 mmol/L Final  . Potassium 07/16/2015 3.3* 3.5 - 5.1 mmol/L Final  . Chloride 07/16/2015 100* 101 - 111 mmol/L Final  . CO2 07/16/2015 29  22 - 32 mmol/L Final  . Glucose, Bld 07/16/2015 106* 65 - 99 mg/dL Final  . BUN 07/16/2015 16  6 - 20 mg/dL Final  . Creatinine, Ser 07/16/2015 0.70  0.44 - 1.00 mg/dL Final  . Calcium 07/16/2015 9.4  8.9 - 10.3 mg/dL Final  . Total Protein 07/16/2015 7.5  6.5 - 8.1 g/dL Final  . Albumin 07/16/2015 4.3  3.5 - 5.0 g/dL Final  . AST 07/16/2015 22  15 - 41 U/L Final  . ALT 07/16/2015 22  14 - 54 U/L Final  . Alkaline Phosphatase 07/16/2015 75  38 - 126 U/L Final  . Total Bilirubin 07/16/2015 0.9  0.3 - 1.2 mg/dL Final  . GFR calc non Af Amer  07/16/2015 >60  >60 mL/min Final  . GFR calc Af Amer 07/16/2015 >60  >60 mL/min Final   Comment: (NOTE) The eGFR has been calculated using the CKD EPI equation. This calculation has not been validated in all clinical situations. eGFR's persistently <60 mL/min signify possible Chronic Kidney Disease.   . Anion gap 07/16/2015 8  5 - 15 Final  . Prothrombin Time 07/16/2015 13.4  11.6 - 15.2 seconds Final  . INR 07/16/2015 1.00  0.00 - 1.49 Final  . ABO/RH(D) 07/16/2015 O POS   Final  . Antibody Screen 07/16/2015 NEG   Final  . Sample Expiration 07/16/2015 07/30/2015   Final  . Color, Urine 07/16/2015 YELLOW  YELLOW Final  . APPearance 07/16/2015 CLEAR  CLEAR Final  . Specific Gravity, Urine 07/16/2015 1.014  1.005 - 1.030 Final  . pH 07/16/2015 7.0  5.0 - 8.0 Final  . Glucose, UA 07/16/2015 NEGATIVE  NEGATIVE mg/dL Final  . Hgb urine dipstick 07/16/2015 NEGATIVE  NEGATIVE Final  . Bilirubin Urine 07/16/2015 NEGATIVE  NEGATIVE Final  . Ketones, ur 07/16/2015 NEGATIVE  NEGATIVE mg/dL Final  . Protein, ur 07/16/2015 NEGATIVE  NEGATIVE mg/dL Final  . Urobilinogen, UA 07/16/2015 0.2  0.0 - 1.0 mg/dL Final  . Nitrite 07/16/2015 NEGATIVE  NEGATIVE Final  . Leukocytes, UA 07/16/2015 TRACE* NEGATIVE Final  . MRSA, PCR 07/16/2015 NEGATIVE  NEGATIVE Final  . Staphylococcus aureus 07/16/2015 NEGATIVE  NEGATIVE Final   Comment:        The Xpert SA Assay (FDA approved for NASAL specimens in patients over 105 years of age), is one component of a comprehensive surveillance program.  Test performance has been validated by [facility] for patients greater than or equal to 17 year old. It is not intended to diagnose infection nor to guide or monitor treatment.   . ABO/RH(D) 07/16/2015 O POS   Final  . Squamous Epithelial / LPF 07/16/2015 RARE  RARE Final  . WBC, UA 07/16/2015 3-6  <3 WBC/hpf Final  . RBC / HPF 07/16/2015 0-2  <3 RBC/hpf Final  . Bacteria, UA 07/16/2015 RARE  RARE Final  .  Edwina Barth 07/16/2015 MUCOUS PRESENT   Final     X-Rays:No results found.  EKG: Orders placed or performed during the hospital encounter of 10/02/14  . EKG 12-Lead  . EKG 12-Lead  . ED EKG  . ED EKG  . EKG     Hospital Course: Kalei Meda is a 64 y.o. who was admitted to [facility]. They were brought to the operating room on  07/28/2015 and underwent Procedure(s): LEFT KNEE MEDIAL UNICOMPARTMENTAL ARTHROPLASTY.  Patient tolerated the procedure well and was later transferred to the recovery room and then to the orthopaedic floor for postoperative care.  They were given PO and IV analgesics for pain control following their surgery.  They were given 24 hours of postoperative antibiotics of  Anti-infectives    Start     Dose/Rate Route Frequency Ordered Stop   07/28/15 1400  ceFAZolin (ANCEF) IVPB 2 g/50 mL premix     2 g 100 mL/hr over 30 Minutes Intravenous Every 6 hours 07/28/15 1136 07/28/15 2116   07/28/15 0618  ceFAZolin (ANCEF) IVPB 2 g/50 mL premix     2 g 100 mL/hr over 30 Minutes Intravenous On call to O.R. 07/28/15 3007 07/28/15 0825     and started on DVT prophylaxis in the form of Xarelto.   PT and OT were ordered for postop therapy protocol.  Discharge planning consulted to help with postop disposition and equipment needs.  Patient had a very good night on the evening of surgery.  They started to get up OOB with therapy on day one. Hemovac drain was pulled without difficulty.  Patient was seen in rounds on day one and it was felt that as long as they did well with the remaining sessions of therapy that they would be ready to go home.  Arrangements were made and they were setup to go home on POD 1.  Discharge home with home health Diet - Cardiac diet Follow up - in 2 weeks on Tuesday 08/12/2015 with Dr. Wynelle Link. Activity - WBAT Dressing - May remove the surgical dressing tomorrow at home and then apply a dry gauze dressing daily. May shower three days following  surgery but do not submerge the incision under water. Disposition - Home Condition Upon Discharge - Good D/C Meds - See DC Summary DVT Prophylaxis Xarelto 10 mg daily for ten days, then change to Aspirin 325 mg daily for two weeks, then reduce to Baby Aspirin 81 mg daily for three additional weeks.  Discharge Instructions    Call MD / Call 911    Complete by:  As directed   If you experience chest pain or shortness of breath, CALL 911 and be transported to the hospital emergency room.  If you develope a fever above 101 F, pus (white drainage) or increased drainage or redness at the wound, or calf pain, call your surgeon's office.     Change dressing    Complete by:  As directed   Change dressing daily with sterile 4 x 4 inch gauze dressing and apply TED hose. Do not submerge the incision under water.     Constipation Prevention    Complete by:  As directed   Drink plenty of fluids.  Prune juice may be helpful.  You may use a stool softener, such as Colace (over the counter) 100 mg twice a day.  Use MiraLax (over the counter) for constipation as needed.     Diet - low sodium heart healthy    Complete by:  As directed      Discharge instructions    Complete by:  As directed   Pick up stool softner and laxative for home. May remove the surgical dressing tomorrow at home and then apply a dry gauze dressing daily to the incision. Do not submerge incision under water. May shower after three days following surgery. Continue to use ice for pain and swelling from surgery.  Xarelto 10 mg  daily for ten days, then change to Aspirin 325 mg daily for two weeks, then reduce to Baby Aspirin 81 mg daily for three additional weeks.  Postoperative Constipation Protocol  Constipation - defined medically as fewer than three stools per week and severe constipation as less than one stool per week.  One of the most common issues patients have following surgery is constipation.  Even if you have a regular  bowel pattern at home, your normal regimen is likely to be disrupted due to multiple reasons following surgery.  Combination of anesthesia, postoperative narcotics, change in appetite and fluid intake all can affect your bowels.  In order to avoid complications following surgery, here are some recommendations in order to help you during your recovery period.  Colace (docusate) - Pick up an over-the-counter form of Colace or another stool softener and take twice a day as long as you are requiring postoperative pain medications.  Take with a full glass of water daily.  If you experience loose stools or diarrhea, hold the colace until you stool forms back up.  If your symptoms do not get better within 1 week or if they get worse, check with your doctor.  Dulcolax (bisacodyl) - Pick up over-the-counter and take as directed by the product packaging as needed to assist with the movement of your bowels.  Take with a full glass of water.  Use this product as needed if not relieved by Colace only.   MiraLax (polyethylene glycol) - Pick up over-the-counter to have on hand.  MiraLax is a solution that will increase the amount of water in your bowels to assist with bowel movements.  Take as directed and can mix with a glass of water, juice, soda, coffee, or tea.  Take if you go more than two days without a movement. Do not use MiraLax more than once per day. Call your doctor if you are still constipated or irregular after using this medication for 7 days in a row.  If you continue to have problems with postoperative constipation, please contact the office for further assistance and recommendations.  If you experience "the worst abdominal pain ever" or develop nausea or vomiting, please contact the office immediatly for further recommendations for treatment.     Do not put a pillow under the knee. Place it under the heel.    Complete by:  As directed      Do not sit on low chairs, stoools or toilet seats, as it may  be difficult to get up from low surfaces    Complete by:  As directed      Driving restrictions    Complete by:  As directed   No driving until released by the physician.     Increase activity slowly as tolerated    Complete by:  As directed      Lifting restrictions    Complete by:  As directed   No lifting until released by the physician.     Patient may shower    Complete by:  As directed   You may shower without a dressing once there is no drainage.  Do not wash over the wound.  If drainage remains, do not shower until drainage stops.     TED hose    Complete by:  As directed   Use stockings (TED hose) for 3 weeks on both leg(s).  You may remove them at night for sleeping.     Weight bearing as tolerated    Complete  by:  As directed   Laterality:  left  Extremity:  Lower            Medication List    STOP taking these medications        aspirin 81 MG tablet     MULTIVITAMIN & MINERAL PO     vitamin B-12 1000 MCG tablet  Commonly known as:  CYANOCOBALAMIN      TAKE these medications        citalopram 10 MG tablet  Commonly known as:  CELEXA  Take 10 mg by mouth at bedtime.     losartan-hydrochlorothiazide 100-25 MG per tablet  Commonly known as:  HYZAAR  Take 1 tablet by mouth every morning.     methocarbamol 500 MG tablet  Commonly known as:  ROBAXIN  Take 1 tablet (500 mg total) by mouth every 6 (six) hours as needed for muscle spasms.     ondansetron 4 MG tablet  Commonly known as:  ZOFRAN  Take 1 tablet (4 mg total) by mouth every 6 (six) hours as needed for nausea.     oxyCODONE 5 MG immediate release tablet  Commonly known as:  Oxy IR/ROXICODONE  Take 1-2 tablets (5-10 mg total) by mouth every 3 (three) hours as needed for moderate pain or severe pain.     rivaroxaban 10 MG Tabs tablet  Commonly known as:  XARELTO  Take 1 tablet (10 mg total) by mouth daily with breakfast. Xarelto 10 mg daily for ten days, then change to Aspirin 325 mg daily for  two weeks, then reduce back to Baby Aspirin 81 mg daily.           Follow-up Information    Follow up with Gearlean Alf, MD On 08/12/2015.   Specialty:  Orthopedic Surgery   Why:  Call office at [phone] to setup appointment on Tuesday 08/12/2015 with Dr. Wynelle Link.   Contact information:   [address] [place] [address] [phone]       Signed: Arlee Muslim, PA-C Orthopaedic Surgery 07/29/2015, 8:50 AM

## 2015-07-29 NOTE — Evaluation (Signed)
Occupational Therapy One Time Evaluation Patient Details Name: Courtney Hurley MRN: [id number] DOB: [birth year] Today's Date: 07/29/2015    History of Present Illness Pt is a 64 year old female s/p L medial unicompartmental knee arthroscopy.   Clinical Impression   Pt doing well. Supposed to d/c today. Husband present for education. All education completed with pt. No further acute OT needs.      Follow Up Recommendations  No OT follow up    Equipment Recommendations  3 in 1 bedside comode    Recommendations for Other Services       Precautions / Restrictions Precautions Precautions: Knee Restrictions Weight Bearing Restrictions: No Other Position/Activity Restrictions: WBAT      Mobility Bed Mobility Overal bed mobility: Modified Independent             General bed mobility comments: HOB raised.   Transfers Overall transfer level: Needs assistance Equipment used: Rolling walker (2 wheeled) Transfers: Sit to/from Stand Sit to Stand: Min guard         General transfer comment: cues for hand placement. min guard for stand to sit also.     Balance                                            ADL Overall ADL's : Needs assistance/impaired Eating/Feeding: Sitting;Independent   Grooming: Wash/dry hands;Min guard;Standing   Upper Body Bathing: Set up;Sitting   Lower Body Bathing: Minimal assistance;Sit to/from stand   Upper Body Dressing : Set up;Sitting   Lower Body Dressing: Minimal assistance;Sit to/from stand   Toilet Transfer: Min guard;Ambulation;BSC;RW   Toileting- Architect and Hygiene: Min guard;Sit to/from stand   Tub/ Shower Transfer: Walk-in shower;Min guard;Rolling walker;3 in 1     General ADL Comments: Pt needed min cues to not pick up the walker off the floor but roll it along. Gave cues on sequence for LB dressing and pt able to don underwear and shorts. Min assist for socks. Practiced shower transfer  and pt plans to use 3in1 as shower chair. Also has a tubbench for tub also. Husband able to help PRN. Educated on Sports administrator as pt desires reacher to pick up objects. Discussed how to adjust 3in1 to appropriate height.      Vision     Perception     Praxis      Pertinent Vitals/Pain Pain Assessment: 0-10 Pain Score: 3  Pain Location: L knee Pain Descriptors / Indicators: Sore Pain Intervention(s): Repositioned     Hand Dominance     Extremity/Trunk Assessment Upper Extremity Assessment Upper Extremity Assessment: Overall WFL for tasks assessed           Communication Communication Communication: No difficulties   Cognition Arousal/Alertness: Awake/alert Behavior During Therapy: WFL for tasks assessed/performed Overall Cognitive Status: Within Functional Limits for tasks assessed                     General Comments       Exercises       Shoulder Instructions      Home Living Family/patient expects to be discharged to:: Private residence Living Arrangements: Spouse/significant other   Type of Home: House Home Access: Stairs to enter [employer] of Steps: 4 Entrance Stairs-Rails: Left Home Layout: One level     Bathroom Shower/Tub: Tub/shower unit;Walk-in shower   Bathroom Toilet: Standard  Home Equipment: Walker - 2 wheels;Crutches;Tub bench          Prior Functioning/Environment Level of Independence: Independent             OT Diagnosis: Generalized weakness   OT Problem List:     OT Treatment/Interventions:      OT Goals(Current goals can be found in the care plan section) Acute Rehab OT Goals Patient Stated Goal: home OT Goal Formulation: With patient/family  OT Frequency:     Barriers to D/C:            Co-evaluation              End of Session Equipment Utilized During Treatment: Rolling walker  Activity Tolerance: Patient tolerated treatment well Patient left: with call bell/phone within  reach;in chair;with family/visitor present   Time: 9604-5409 OT Time Calculation (min): 35 min Charges:  OT General Charges $OT Visit: 1 Procedure OT Evaluation $Initial OT Evaluation Tier I: 1 Procedure OT Treatments $Therapeutic Activity: 8-22 mins G-Codes:    Lennox Laity  [phone] 07/29/2015, 9:46 AM

## 2015-07-29 NOTE — Discharge Instructions (Addendum)
° °Dr. Frank Aluisio °Total Joint Specialist °[facility] °[address] °[place], [place] [address] °[phone] ° °UNI KNEE REPLACEMENT POSTOPERATIVE DIRECTIONS ° ° °Knee Rehabilitation, Guidelines Following Surgery  °Results after knee surgery are often greatly improved when you follow the exercise, range of motion and muscle strengthening exercises prescribed by your doctor. Safety measures are also important to protect the knee from further injury. Any time any of these exercises cause you to have increased pain or swelling in your knee joint, decrease the amount until you are comfortable again and slowly increase them. If you have problems or questions, call your caregiver or physical therapist for advice.  ° °HOME CARE INSTRUCTIONS  °Remove items at home which could result in a fall. This includes throw rugs or furniture in walking pathways.  °· ICE to the affected knee every three hours for 30 minutes at a time and then as needed for pain and swelling.  Continue to use ice on the knee for pain and swelling from surgery. You may notice swelling that will progress down to the foot and ankle.  This is normal after surgery.  Elevate the leg when you are not up walking on it.   °· Continue to use the breathing machine which will help keep your temperature down.  It is common for your temperature to cycle up and down following surgery, especially at night when you are not up moving around and exerting yourself.  The breathing machine keeps your lungs expanded and your temperature down. °· Do not place pillow under knee, focus on keeping the knee straight while resting ° °DIET °You may resume your previous home diet once your are discharged from the hospital. ° °DRESSING / WOUND CARE / SHOWERING °You may shower 3 days after surgery, but keep the wounds dry during showering.  You may use an occlusive plastic wrap (Press'n Seal for example), NO SOAKING/SUBMERGING IN THE BATHTUB.  If the  bandage gets wet, change with a clean dry gauze.  If the incision gets wet, pat the wound dry with a clean towel. °You may start showering once you are discharged home but do not submerge the incision under water. Just pat the incision dry and apply a dry gauze dressing on daily. °Change the surgical dressing daily and reapply a dry dressing each time. ° °ACTIVITY °Walk with your walker as instructed. °Use walker as long as suggested by your caregivers. °Avoid periods of inactivity such as sitting longer than an hour when not asleep. This helps prevent blood clots.  °You may resume a sexual relationship in one month or when given the OK by your doctor.  °You may return to work once you are cleared by your doctor.  °Do not drive a car for 6 weeks or until released by you surgeon.  °Do not drive while taking narcotics. ° °WEIGHT BEARING °Weight bearing as tolerated with assist device (walker, cane, etc) as directed, use it as long as suggested by your surgeon or therapist, typically at least 4-6 weeks. ° °POSTOPERATIVE CONSTIPATION PROTOCOL °Constipation - defined medically as fewer than three stools per week and severe constipation as less than one stool per week. ° °One of the most common issues patients have following surgery is constipation.  Even if you have a regular bowel pattern at home, your normal regimen is likely to be disrupted due to multiple reasons following surgery.  Combination of anesthesia, postoperative narcotics, change in appetite and fluid intake all can affect your bowels.    In order to avoid complications following surgery, here are some recommendations in order to help you during your recovery period. ° °Colace (docusate) - Pick up an over-the-counter form of Colace or another stool softener and take twice a day as long as you are requiring postoperative pain medications.  Take with a full glass of water daily.  If you experience loose stools or diarrhea, hold the colace until you stool forms  back up.  If your symptoms do not get better within 1 week or if they get worse, check with your doctor. ° °Dulcolax (bisacodyl) - Pick up over-the-counter and take as directed by the product packaging as needed to assist with the movement of your bowels.  Take with a full glass of water.  Use this product as needed if not relieved by Colace only.  ° °MiraLax (polyethylene glycol) - Pick up over-the-counter to have on hand.  MiraLax is a solution that will increase the amount of water in your bowels to assist with bowel movements.  Take as directed and can mix with a glass of water, juice, soda, coffee, or tea.  Take if you go more than two days without a movement. °Do not use MiraLax more than once per day. Call your doctor if you are still constipated or irregular after using this medication for 7 days in a row. ° °If you continue to have problems with postoperative constipation, please contact the office for further assistance and recommendations.  If you experience "the worst abdominal pain ever" or develop nausea or vomiting, please contact the office immediatly for further recommendations for treatment. ° °ITCHING ° If you experience itching with your medications, try taking only a single pain pill, or even half a pain pill at a time.  You can also use Benadryl over the counter for itching or also to help with sleep.  ° °TED HOSE STOCKINGS °Wear the elastic stockings on both legs for three weeks following surgery during the day but you may remove then at night for sleeping. ° °MEDICATIONS °See your medication summary on the “After Visit Summary” that the nursing staff will review with you prior to discharge.  You may have some home medications which will be placed on hold until you complete the course of blood thinner medication.  It is important for you to complete the blood thinner medication as prescribed by your surgeon.  Continue your approved medications as instructed at time of  discharge. ° °PRECAUTIONS °If you experience chest pain or shortness of breath - call 911 immediately for transfer to the hospital emergency department.  °If you develop a fever greater that 101 F, purulent drainage from wound, increased redness or drainage from wound, foul odor from the wound/dressing, or calf pain - CONTACT YOUR SURGEON.   °                                                °FOLLOW-UP APPOINTMENTS °Make sure you keep all of your appointments after your operation with your surgeon and caregivers. You should call the office at the above phone number and make an appointment for approximately two weeks after the date of your surgery or on the date instructed by your surgeon outlined in the "After Visit Summary". ° °RANGE OF MOTION AND STRENGTHENING EXERCISES  °Rehabilitation of the knee is important following a knee injury or an   operation. After just a few days of immobilization, the muscles of the thigh which control the knee become weakened and shrink (atrophy). Knee exercises are designed to build up the tone and strength of the thigh muscles and to improve knee motion. Often times heat used for twenty to thirty minutes before working out will loosen up your tissues and help with improving the range of motion but do not use heat for the first two weeks following surgery. These exercises can be done on a training (exercise) mat, on the floor, on a table or on a bed. Use what ever works the best and is most comfortable for you Knee exercises include:  °Leg Lifts - While your knee is still immobilized in a splint or cast, you can do straight leg raises. Lift the leg to 60 degrees, hold for 3 sec, and slowly lower the leg. Repeat 10-20 times 2-3 times daily. Perform this exercise against resistance later as your knee gets better.  °Quad and Hamstring Sets - Tighten up the muscle on the front of the thigh (Quad) and hold for 5-10 sec. Repeat this 10-20 times hourly. Hamstring sets are done by pushing the  foot backward against an object and holding for 5-10 sec. Repeat as with quad sets.  °· Leg Slides: Lying on your back, slowly slide your foot toward your buttocks, bending your knee up off the floor (only go as far as is comfortable). Then slowly slide your foot back down until your leg is flat on the floor again. °· Angel Wings: Lying on your back spread your legs to the side as far apart as you can without causing discomfort.  °A rehabilitation program following serious knee injuries can speed recovery and prevent re-injury in the future due to weakened muscles. Contact your doctor or a physical therapist for more information on knee rehabilitation.  ° °IF YOU ARE TRANSFERRED TO A SKILLED REHAB FACILITY °If the patient is transferred to a skilled rehab facility following release from the hospital, a list of the current medications will be sent to the facility for the patient to continue.  When discharged from the skilled rehab facility, please have the facility set up the patient's Home Health Physical Therapy prior to being released. Also, the skilled facility will be responsible for providing the patient with their medications at time of release from the facility to include their pain medication, the muscle relaxants, and their blood thinner medication. If the patient is still at the rehab facility at time of the two week follow up appointment, the skilled rehab facility will also need to assist the patient in arranging follow up appointment in our office and any transportation needs. ° °MAKE SURE YOU:  °Understand these instructions.  °Get help right away if you are not doing well or get worse.  ° ° °Pick up stool softner and laxative for home use following surgery while on pain medications. °Do not submerge incision under water. °Please use good hand washing techniques while changing dressing each day. °May shower starting three days after surgery. °Please use a clean towel to pat the incision dry following  showers. °Continue to use ice for pain and swelling after surgery. °Do not use any lotions or creams on the incision until instructed by your surgeon. ° °Take Xarelto 10 mg daily for ten days, then change to Aspirin 325 mg daily for two weeks, then reduce to Baby Aspirin 81 mg daily for three additional weeks. ° ° °________________________ ° °Information on my   medicine - XARELTO® (Rivaroxaban) ° °This medication education was reviewed with me or my healthcare representative as part of my discharge preparation.   ° °Why was Xarelto® prescribed for you? °Xarelto® was prescribed for you to reduce the risk of blood clots forming after orthopedic surgery. The medical term for these abnormal blood clots is venous thromboembolism (VTE). ° °What do you need to know about xarelto® ? °Take your Xarelto® ONCE DAILY at the same time every day. °You may take it either with or without food. ° °If you have difficulty swallowing the tablet whole, you may crush it and mix in applesauce just prior to taking your dose. ° °Take Xarelto® exactly as prescribed by your doctor and DO NOT stop taking Xarelto® without talking to the doctor who prescribed the medication.  Stopping without other VTE prevention medication to take the place of Xarelto® may increase your risk of developing a clot. ° °After discharge, you should have regular check-up appointments with your healthcare provider that is prescribing your Xarelto®.   ° °What do you do if you miss a dose? °If you miss a dose, take it as soon as you remember on the same day then continue your regularly scheduled once daily regimen the next day. Do not take two doses of Xarelto® on the same day.  ° °Important Safety Information °A possible side effect of Xarelto® is bleeding. You should call your healthcare provider right away if you experience any of the following: °? Bleeding from an injury or your nose that does not stop. °? Unusual colored urine (red or dark brown) or unusual colored  stools (red or black). °? Unusual bruising for unknown reasons. °? A serious fall or if you hit your head (even if there is no bleeding). ° °Some medicines may interact with Xarelto® and might increase your risk of bleeding while on Xarelto®. To help avoid this, consult your healthcare provider or pharmacist prior to using any new prescription or non-prescription medications, including herbals, vitamins, non-steroidal anti-inflammatory drugs (NSAIDs) and supplements. ° °This website has more information on Xarelto®: www.xarelto.com. ° ° °

## 2015-07-29 NOTE — Progress Notes (Signed)
   Subjective: 1 Day Post-Op Procedure(s) (LRB): LEFT KNEE MEDIAL UNICOMPARTMENTAL ARTHROPLASTY (Left) Patient reports pain as mild.   Patient seen in rounds with Dr. Lequita Halt.  Husband in room. Patient is well, but has had some minor complaints of pain in the knee, requiring pain medications Patient is ready to go home today.  Objective: Vital signs in last 24 hours: Temp:  [97.3 F (36.3 C)-98.3 F (36.8 C)] 98.2 F (36.8 C) (08/30 0618) Pulse Rate:  [61-71] 63 (08/30 0618) Resp:  [11-18] 16 (08/30 0618) BP: (101-126)/(45-101) 121/56 mmHg (08/30 0654) SpO2:  [96 %-100 %] 100 % (08/30 0618) Weight:  [86.637 kg (191 lb)] 86.637 kg (191 lb) (08/29 1112)  Intake/Output from previous day:  Intake/Output Summary (Last 24 hours) at 07/29/15 0837 Last data filed at 07/29/15 0619  Gross per 24 hour  Intake 4212.5 ml  Output   1820 ml  Net 2392.5 ml    Intake/Output this shift: UOP 400 on last shift  Labs:  Recent Labs  07/29/15 0555  HGB 10.9*    Recent Labs  07/29/15 0555  WBC 13.8*  RBC 4.09  HCT 33.7*  PLT 221    Recent Labs  07/29/15 0555  NA 140  K 3.8  CL 106  CO2 26  BUN 15  CREATININE 0.74  GLUCOSE 125*  CALCIUM 8.9   No results for input(s): LABPT, INR in the last 72 hours.  EXAM: General - Patient is Alert, Appropriate and Oriented Extremity - Neurovascular intact Sensation intact distally Dorsiflexion/Plantar flexion intact Dressing - clean, dry Motor Function - intact, moving foot and toes well on exam.  Hemovac pulled without difficulty.  Assessment/Plan: 1 Day Post-Op Procedure(s) (LRB): LEFT KNEE MEDIAL UNICOMPARTMENTAL ARTHROPLASTY (Left) Procedure(s) (LRB): LEFT KNEE MEDIAL UNICOMPARTMENTAL ARTHROPLASTY (Left) Past Medical History  Diagnosis Date  . Hypertension   . PONV (postoperative nausea and vomiting)   . Anxiety   . GERD (gastroesophageal reflux disease)     hx of   . Esophageal spasm   . Arthritis    Principal  Problem:   OA (osteoarthritis) of knee  Estimated body mass index is 30.84 kg/(m^2) as calculated from the following:   Height as of this encounter:  (1.676 m).   Weight as of this encounter: 86.637 kg (191 lb). Advance diet Up with therapy Discharge home with home health Diet - Cardiac diet Follow up - in 2 weeks on Tuesday 08/12/2015 with Dr. Lequita Halt. Activity - WBAT Dressing - May remove the surgical dressing tomorrow at home and then apply a dry gauze dressing daily. May shower three days following surgery but do not submerge the incision under water. Disposition - Home Condition Upon Discharge - Good D/C Meds - See DC Summary DVT Prophylaxis Xarelto 10 mg daily for ten days, then change to Aspirin 325 mg daily for two weeks, then reduce to Baby Aspirin 81 mg daily for three additional weeks.  Avel Peace, PA-C Orthopaedic Surgery 07/29/2015, 8:37 AM

## 2015-07-29 NOTE — Progress Notes (Addendum)
Physical Therapy Treatment Patient Details Name: Courtney Hurley MRN: [id number] DOB: 1950/12/26 Today's Date: 07/29/2015    History of Present Illness Pt is a 64 year old female s/p L medial unicompartmental knee arthroplasty    PT Comments    Pt mobilizing well and practiced safe stair technique with spouse present.  Pt also performed LE exercises and provided with HEP handout.  Pt and spouse had no further questions and feel ready for d/c home today.  Follow Up Recommendations  Home health PT     Equipment Recommendations  None recommended by PT    Recommendations for Other Services       Precautions / Restrictions Precautions Precautions: Knee Precaution Comments: able to perform SLR Restrictions Other Position/Activity Restrictions: WBAT    Mobility  Bed Mobility Overal bed mobility: Modified Independent             General bed mobility comments: HOB raised.   Transfers Overall transfer level: Needs assistance Equipment used: Rolling walker (2 wheeled) Transfers: Sit to/from Stand Sit to Stand: Min guard         General transfer comment: verbal cues for UE and LE positioning  Ambulation/Gait Ambulation/Gait assistance: Min guard Ambulation Distance (Feet): 200 Feet Assistive device: Rolling walker (2 wheeled) Gait Pattern/deviations: Step-to pattern;Antalgic     General Gait Details: verbal cues for sequence, RW positioning, heel strike   Stairs Stairs: Yes Stairs assistance: Min guard Stair Management: Step to pattern;Forwards;With crutches;One rail Left Number of Stairs: 2 General stair comments: verbal cues for safe sequence, crutch positioning, spouse present and observed, performed twice  Wheelchair Mobility    Modified Rankin (Stroke Patients Only)       Balance                                    Cognition Arousal/Alertness: Awake/alert Behavior During Therapy: WFL for tasks assessed/performed Overall  Cognitive Status: Within Functional Limits for tasks assessed                      Exercises Total Joint Exercises Ankle Circles/Pumps: AROM;Both;10 reps Quad Sets: AROM;Both;10 reps Short Arc Quad: AROM;Both;10 reps Heel Slides: AAROM;Left;10 reps Hip ABduction/ADduction: AROM;Left;10 reps Straight Leg Raises: AROM;Left;10 reps Goniometric ROM: approx 70* knee flexion AAROM with heel slides    General Comments        Pertinent Vitals/Pain Pain Assessment: 0-10 Pain Score: 4  Pain Location: L knee Pain Descriptors / Indicators: Sore Pain Intervention(s): Limited activity within patient's tolerance;Monitored during session;Repositioned;Premedicated before session;Ice applied    Home Living Family/patient expects to be discharged to:: Private residence Living Arrangements: Spouse/significant other   Type of Home: House Home Access: Stairs to enter Entrance Stairs-Rails: Left Home Layout: One level Home Equipment: Environmental consultant - 2 wheels;Crutches;Tub bench      Prior Function Level of Independence: Independent          PT Goals (current goals can now be found in the care plan section) Acute Rehab PT Goals Patient Stated Goal: home Progress towards PT goals: Progressing toward goals    Frequency  7X/week    PT Plan Current plan remains appropriate    Co-evaluation             End of Session   Activity Tolerance: Patient tolerated treatment well Patient left: with call bell/phone within reach;with family/visitor present;in bed     Time: 4098-1191 PT Time Calculation (  min) (ACUTE ONLY): 24 min  Charges:  $Gait Training: 8-22 mins $Therapeutic Exercise: 8-22 mins                    G Codes:      Soma Lizak,KATHrine E 08/26/15, 12:37 PM Zenovia Jarred, PT, DPT Aug 26, 2015 Pager: [phone]

## 2015-09-04 IMAGING — CT CT KNEE*L* W/O CM
3 of 5 series · 6 of 14 positions shown, 7 images · non-contrast
Comparison: None.

CLINICAL DATA: Primary osteoarthritis the left knee. Pre-surgical
planning for partial knee replacement. [facility] protocol.

EXAM:
CT OF THE LEFT HIP WITHOUT CONTRAST, CT OF THE LEFT KNEE WITHOUT
CONTRAST, CT OF THE LEFT ANKLE WITHOUT CONTRAST
TECHNIQUE: Multidetector CT imaging of the left hip, knee and ankle was
performed according to the [facility] protocol. Multiplanar CT image
reconstructions were also generated.

[Series 5: knee soft · axial · 0.38mm/px · z∈[-594,-490]mm · 2 of 128 slices shown]
[im 43/128  soft-tissue]
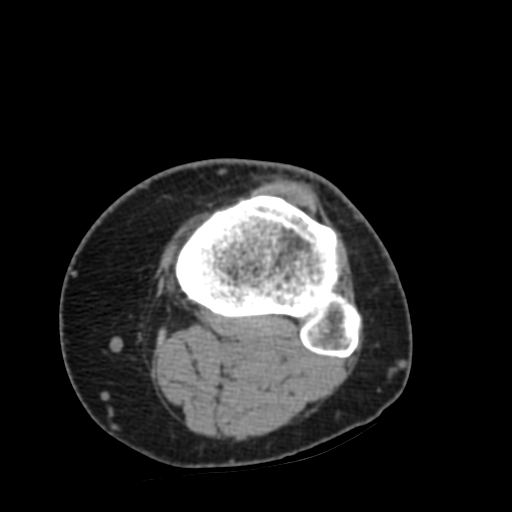
[im 85/128  soft-tissue]
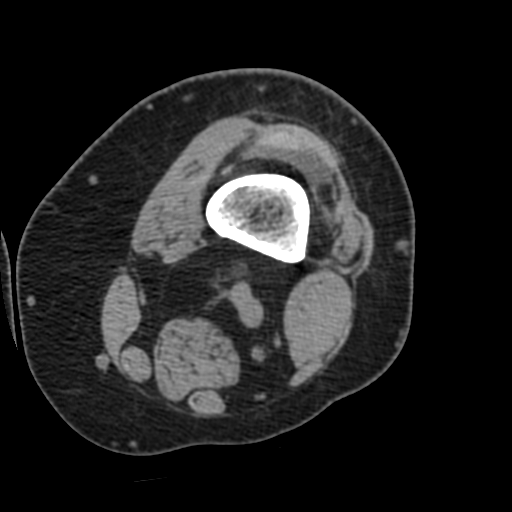

[Series 400: sag · sagittal · 0.64mm/px · 2 of 160 slices shown, 3 images]
[im 54/160  soft-tissue]
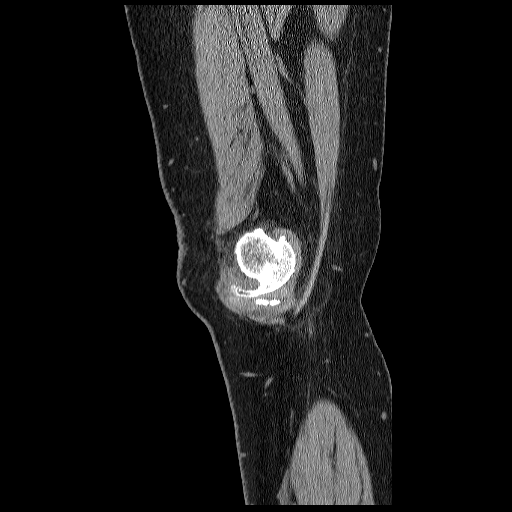
[im 54/160  bone]
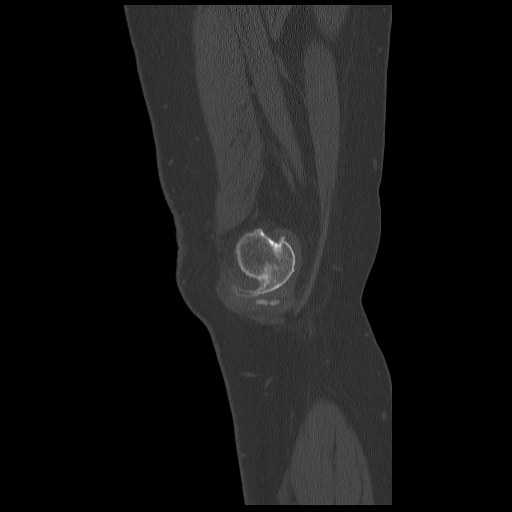
[im 107/160  bone]
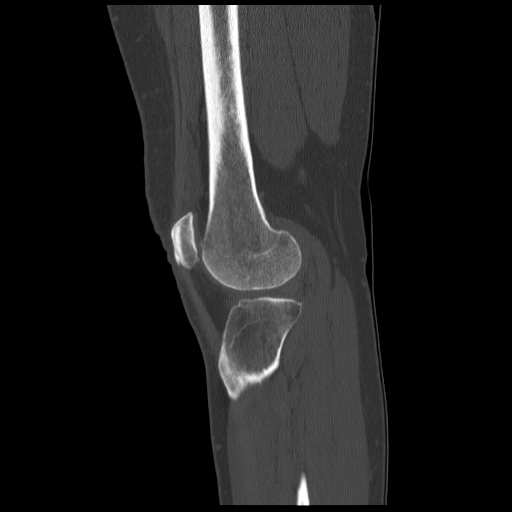

[Series 401: cor · coronal · 0.64mm/px · 2 of 149 slices shown]
[im 50/149  bone]
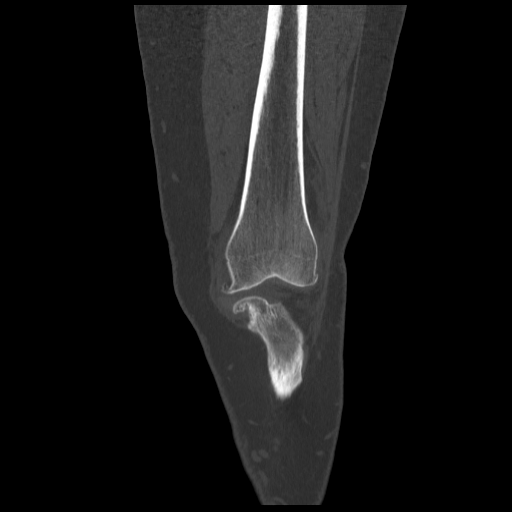
[im 99/149  bone]
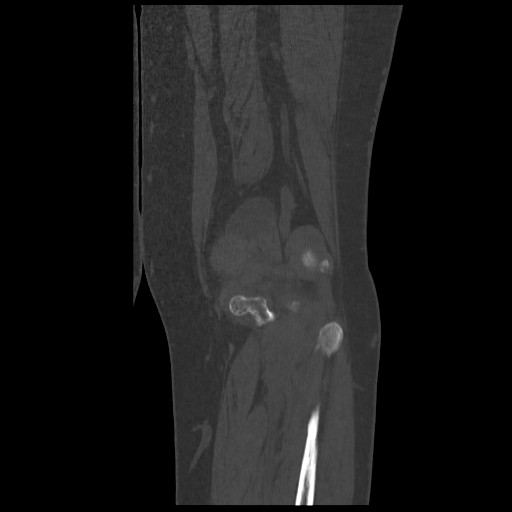

[6 of 14 positions shown; findings below may reference images not displayed]

FINDINGS: CT scan of the left hip:  Normal.

CT scan of the left knee: Severe osteoarthritis of the medial
compartment with complete joint space loss with marginal osteophyte
formation and sclerosis of the tibial plateau and femoral condyle.
Smaller marginal osteophytes in the lateral compartment. Small joint
effusion. Marginal osteophytes in the patellofemoral compartment.

CT scan of the left ankle: Normal.
IMPRESSION: Severe osteoarthritis of the medial compartment of the left knee.
Images of the hip and ankle obtained for pre-surgical planning
purposes.

## 2016-08-26 ENCOUNTER — Ambulatory Visit: Payer: Medicare Other | Admitting: Podiatry

## 2017-06-30 ENCOUNTER — Encounter: Payer: Self-pay | Admitting: Registered"

## 2017-06-30 ENCOUNTER — Encounter: Payer: Medicare Other | Attending: Family Medicine | Admitting: Registered"

## 2017-06-30 DIAGNOSIS — Z6830 Body mass index (BMI) 30.0-30.9, adult: Secondary | ICD-10-CM | POA: Insufficient documentation

## 2017-06-30 DIAGNOSIS — E669 Obesity, unspecified: Secondary | ICD-10-CM | POA: Diagnosis present

## 2017-06-30 NOTE — Patient Instructions (Addendum)
-   Add in snack items during the day such as greek yogurt, fruit with peanut butter, cheese.   - Choose reduced-fat options when shopping.   - Choose low-sodium items.   - Take a snack with you before eating out with friends and at special events.

## 2017-06-30 NOTE — Progress Notes (Signed)
  Medical Nutrition Therapy:  Appt start time: 3:05 end time:  3:55.   Assessment:  Primary concerns today: Pt states she is concerned about her waist thickening and wants to lose weight. Pt states swims a few days a week and walks a few days a week. Pt states her guilty pleasures are bread. Pt reports she does not have cravings for sweet things like cookies or candy. Pt states she does not do well with artificial sweeteners. Pt states she does not eat fried food because it makes her feel sick. Pt states she will begin strength-training with a trainer tomorrow to help with learning machines to build muscle.    Preferred Learning Style:   No preference indicated   Learning Readiness:   Ready  Change in progress   MEDICATIONS: See list   DIETARY INTAKE:  Usual eating pattern includes 3 meals and maybe 1 snack per day.  Avoided foods include french fries.    24-hr recall:  B ( AM): grape nuts w/ blueberries  Snk ( AM): none  L ( PM): salad w/ tuna Snk ( PM): sometimes cashews or yogurt D ( PM): barbecue chicken, cole slaw, baked beans Snk ( PM): none Beverages: water, 2 glasses/day (1/2 sweet + 1/2 unsweet tea)  Usual physical activity: Swimming 35-40 min, 2-3x/wk; walking 30-35 min, 2x/wk  Estimated energy needs: 2000 calories 225 g carbohydrates 150 g protein 56 g fat  Progress Towards Goal(s):  In progress.   Nutritional Diagnosis:  NB-1.1 Food and nutrition-related knowledge deficit As related to lack of prior nutrition-related education.  As evidenced by pt's dietary recall of not consistently eating snacks and eating larger dinner portions.    Intervention:  Nutrition education and counseling. RD encouraged pt to incorporate healthy snacks between meals to help with portion control during dinner time. Pt was given handouts for arm exercises that can be done at home for strengthening muscles.   Goals: - Add in snack items during the day such as greek yogurt, fruit  with peanut butter, cheese.  - Choose reduced-fat options when shopping.  - Choose low-sodium items.  - Take a snack with you before eating out with friends and at special events.  Teaching Method Utilized:  Visual Auditory Hands on  Handouts given during visit include:  Healthy Snack Ideas  Arm Exercises  Barriers to learning/adherence to lifestyle change: none  Demonstrated degree of understanding via:  Teach Back   Monitoring/Evaluation:  Dietary intake, exercise, and body weight prn.

## 2019-12-26 ENCOUNTER — Ambulatory Visit: Payer: Medicare Other

## 2020-01-04 ENCOUNTER — Ambulatory Visit: Payer: Medicare Other

## 2020-01-16 ENCOUNTER — Ambulatory Visit: Payer: Medicare Other

## 2020-02-01 ENCOUNTER — Ambulatory Visit: Payer: Medicare PPO | Attending: Internal Medicine

## 2020-02-01 DIAGNOSIS — Z23 Encounter for immunization: Secondary | ICD-10-CM

## 2020-02-01 NOTE — Progress Notes (Signed)
   Covid-19 Vaccination Clinic  Name:  Jesika Men    MRN: [id number] DOB: [birth year]  02/01/2020  Ms. Scafidi was observed post Covid-19 immunization for 15 minutes without incident. She was provided with Vaccine Information Sheet and instruction to access the V-Safe system.   Ms. Hands was instructed to call 911 with any severe reactions post vaccine: Marland Kitchen Difficulty breathing  . Swelling of face and throat  . A fast heartbeat  . A bad rash all over body  . Dizziness and weakness   Immunizations Administered    Name Date Dose VIS Date Route   Pfizer COVID-19 Vaccine 02/01/2020  8:20 AM 0.3 mL 11/09/2019 Intramuscular   Manufacturer: [employer], [employer]   Lot: [id number]   NDC: [address]-5

## 2020-02-27 ENCOUNTER — Ambulatory Visit: Payer: Medicare PPO | Attending: Internal Medicine

## 2020-02-27 DIAGNOSIS — Z23 Encounter for immunization: Secondary | ICD-10-CM

## 2020-02-27 NOTE — Progress Notes (Signed)
   Covid-19 Vaccination Clinic  Name:  Olesya Wike    MRN: [id number] DOB: 06-18-1951  02/27/2020  Ms. Cleverly was observed post Covid-19 immunization for 15 minutes without incident. She was provided with Vaccine Information Sheet and instruction to access the V-Safe system.   Ms. Scoville was instructed to call 911 with any severe reactions post vaccine: Marland Kitchen Difficulty breathing  . Swelling of face and throat  . A fast heartbeat  . A bad rash all over body  . Dizziness and weakness   Immunizations Administered    Name Date Dose VIS Date Route   Pfizer COVID-19 Vaccine 02/27/2020  9:13 AM 0.3 mL 11/09/2019 Intramuscular   Manufacturer: [employer], [employer]   Lot: [id number]   NDC: [address]-2

## 2020-06-23 ENCOUNTER — Ambulatory Visit (INDEPENDENT_AMBULATORY_CARE_PROVIDER_SITE_OTHER): Payer: Medicare Other | Admitting: Bariatrics

## 2020-07-07 ENCOUNTER — Ambulatory Visit (INDEPENDENT_AMBULATORY_CARE_PROVIDER_SITE_OTHER): Payer: Medicare PPO | Admitting: Bariatrics

## 2020-07-07 ENCOUNTER — Ambulatory Visit (INDEPENDENT_AMBULATORY_CARE_PROVIDER_SITE_OTHER): Payer: Medicare Other | Admitting: Bariatrics

## 2020-07-21 ENCOUNTER — Ambulatory Visit (INDEPENDENT_AMBULATORY_CARE_PROVIDER_SITE_OTHER): Payer: Medicare PPO | Admitting: Bariatrics

## 2022-04-21 DIAGNOSIS — X32XXXD Exposure to sunlight, subsequent encounter: Secondary | ICD-10-CM | POA: Diagnosis not present

## 2022-04-21 DIAGNOSIS — D225 Melanocytic nevi of trunk: Secondary | ICD-10-CM | POA: Diagnosis not present

## 2022-04-21 DIAGNOSIS — L821 Other seborrheic keratosis: Secondary | ICD-10-CM | POA: Diagnosis not present

## 2022-04-21 DIAGNOSIS — L570 Actinic keratosis: Secondary | ICD-10-CM | POA: Diagnosis not present

## 2022-04-21 DIAGNOSIS — D0359 Melanoma in situ of other part of trunk: Secondary | ICD-10-CM | POA: Diagnosis not present

## 2022-04-21 DIAGNOSIS — B078 Other viral warts: Secondary | ICD-10-CM | POA: Diagnosis not present

## 2022-04-29 DIAGNOSIS — D0359 Melanoma in situ of other part of trunk: Secondary | ICD-10-CM | POA: Diagnosis not present

## 2022-04-29 DIAGNOSIS — L988 Other specified disorders of the skin and subcutaneous tissue: Secondary | ICD-10-CM | POA: Diagnosis not present

## 2022-06-18 DIAGNOSIS — Z08 Encounter for follow-up examination after completed treatment for malignant neoplasm: Secondary | ICD-10-CM | POA: Diagnosis not present

## 2022-06-18 DIAGNOSIS — D225 Melanocytic nevi of trunk: Secondary | ICD-10-CM | POA: Diagnosis not present

## 2022-06-18 DIAGNOSIS — Z85820 Personal history of malignant melanoma of skin: Secondary | ICD-10-CM | POA: Diagnosis not present

## 2022-06-18 DIAGNOSIS — Z1283 Encounter for screening for malignant neoplasm of skin: Secondary | ICD-10-CM | POA: Diagnosis not present

## 2022-07-07 ENCOUNTER — Encounter (INDEPENDENT_AMBULATORY_CARE_PROVIDER_SITE_OTHER): Payer: Self-pay

## 2022-08-09 DIAGNOSIS — F419 Anxiety disorder, unspecified: Secondary | ICD-10-CM | POA: Diagnosis not present

## 2022-08-09 DIAGNOSIS — I10 Essential (primary) hypertension: Secondary | ICD-10-CM | POA: Diagnosis not present

## 2022-09-01 DIAGNOSIS — D225 Melanocytic nevi of trunk: Secondary | ICD-10-CM | POA: Diagnosis not present

## 2022-09-01 DIAGNOSIS — Z85820 Personal history of malignant melanoma of skin: Secondary | ICD-10-CM | POA: Diagnosis not present

## 2022-09-01 DIAGNOSIS — Z08 Encounter for follow-up examination after completed treatment for malignant neoplasm: Secondary | ICD-10-CM | POA: Diagnosis not present

## 2022-09-01 DIAGNOSIS — D485 Neoplasm of uncertain behavior of skin: Secondary | ICD-10-CM | POA: Diagnosis not present

## 2022-09-01 DIAGNOSIS — Z1283 Encounter for screening for malignant neoplasm of skin: Secondary | ICD-10-CM | POA: Diagnosis not present

## 2022-09-13 DIAGNOSIS — D225 Melanocytic nevi of trunk: Secondary | ICD-10-CM | POA: Diagnosis not present

## 2022-09-13 DIAGNOSIS — D485 Neoplasm of uncertain behavior of skin: Secondary | ICD-10-CM | POA: Diagnosis not present

## 2022-09-13 DIAGNOSIS — L98429 Non-pressure chronic ulcer of back with unspecified severity: Secondary | ICD-10-CM | POA: Diagnosis not present

## 2022-12-14 DIAGNOSIS — E782 Mixed hyperlipidemia: Secondary | ICD-10-CM | POA: Diagnosis not present

## 2022-12-14 DIAGNOSIS — I10 Essential (primary) hypertension: Secondary | ICD-10-CM | POA: Diagnosis not present

## 2022-12-16 DIAGNOSIS — Z0000 Encounter for general adult medical examination without abnormal findings: Secondary | ICD-10-CM | POA: Diagnosis not present

## 2022-12-16 DIAGNOSIS — E782 Mixed hyperlipidemia: Secondary | ICD-10-CM | POA: Diagnosis not present

## 2022-12-16 DIAGNOSIS — I10 Essential (primary) hypertension: Secondary | ICD-10-CM | POA: Diagnosis not present

## 2022-12-16 DIAGNOSIS — Z6825 Body mass index (BMI) 25.0-25.9, adult: Secondary | ICD-10-CM | POA: Diagnosis not present

## 2022-12-16 DIAGNOSIS — F419 Anxiety disorder, unspecified: Secondary | ICD-10-CM | POA: Diagnosis not present

## 2022-12-28 DIAGNOSIS — X32XXXD Exposure to sunlight, subsequent encounter: Secondary | ICD-10-CM | POA: Diagnosis not present

## 2022-12-28 DIAGNOSIS — Z1283 Encounter for screening for malignant neoplasm of skin: Secondary | ICD-10-CM | POA: Diagnosis not present

## 2022-12-28 DIAGNOSIS — L821 Other seborrheic keratosis: Secondary | ICD-10-CM | POA: Diagnosis not present

## 2022-12-28 DIAGNOSIS — L570 Actinic keratosis: Secondary | ICD-10-CM | POA: Diagnosis not present

## 2022-12-28 DIAGNOSIS — Z08 Encounter for follow-up examination after completed treatment for malignant neoplasm: Secondary | ICD-10-CM | POA: Diagnosis not present

## 2022-12-28 DIAGNOSIS — Z85820 Personal history of malignant melanoma of skin: Secondary | ICD-10-CM | POA: Diagnosis not present

## 2023-03-17 DIAGNOSIS — H43811 Vitreous degeneration, right eye: Secondary | ICD-10-CM | POA: Diagnosis not present

## 2023-03-17 DIAGNOSIS — Z961 Presence of intraocular lens: Secondary | ICD-10-CM | POA: Diagnosis not present

## 2023-04-01 DIAGNOSIS — F430 Acute stress reaction: Secondary | ICD-10-CM | POA: Diagnosis not present

## 2023-04-01 DIAGNOSIS — Z6825 Body mass index (BMI) 25.0-25.9, adult: Secondary | ICD-10-CM | POA: Diagnosis not present

## 2023-05-03 DIAGNOSIS — Z1283 Encounter for screening for malignant neoplasm of skin: Secondary | ICD-10-CM | POA: Diagnosis not present

## 2023-05-03 DIAGNOSIS — C44519 Basal cell carcinoma of skin of other part of trunk: Secondary | ICD-10-CM | POA: Diagnosis not present

## 2023-05-03 DIAGNOSIS — D485 Neoplasm of uncertain behavior of skin: Secondary | ICD-10-CM | POA: Diagnosis not present

## 2023-05-03 DIAGNOSIS — Z85820 Personal history of malignant melanoma of skin: Secondary | ICD-10-CM | POA: Diagnosis not present

## 2023-05-03 DIAGNOSIS — D225 Melanocytic nevi of trunk: Secondary | ICD-10-CM | POA: Diagnosis not present

## 2023-05-03 DIAGNOSIS — D2272 Melanocytic nevi of left lower limb, including hip: Secondary | ICD-10-CM | POA: Diagnosis not present

## 2023-05-03 DIAGNOSIS — C44311 Basal cell carcinoma of skin of nose: Secondary | ICD-10-CM | POA: Diagnosis not present

## 2023-05-03 DIAGNOSIS — Z08 Encounter for follow-up examination after completed treatment for malignant neoplasm: Secondary | ICD-10-CM | POA: Diagnosis not present

## 2023-05-03 DIAGNOSIS — L720 Epidermal cyst: Secondary | ICD-10-CM | POA: Diagnosis not present

## 2023-06-06 DIAGNOSIS — F419 Anxiety disorder, unspecified: Secondary | ICD-10-CM | POA: Diagnosis not present

## 2023-06-06 DIAGNOSIS — Z6825 Body mass index (BMI) 25.0-25.9, adult: Secondary | ICD-10-CM | POA: Diagnosis not present

## 2023-08-12 DIAGNOSIS — Z85820 Personal history of malignant melanoma of skin: Secondary | ICD-10-CM | POA: Diagnosis not present

## 2023-08-12 DIAGNOSIS — Z1283 Encounter for screening for malignant neoplasm of skin: Secondary | ICD-10-CM | POA: Diagnosis not present

## 2023-08-12 DIAGNOSIS — Z85828 Personal history of other malignant neoplasm of skin: Secondary | ICD-10-CM | POA: Diagnosis not present

## 2023-08-12 DIAGNOSIS — D225 Melanocytic nevi of trunk: Secondary | ICD-10-CM | POA: Diagnosis not present

## 2023-08-12 DIAGNOSIS — X32XXXD Exposure to sunlight, subsequent encounter: Secondary | ICD-10-CM | POA: Diagnosis not present

## 2023-08-12 DIAGNOSIS — L570 Actinic keratosis: Secondary | ICD-10-CM | POA: Diagnosis not present

## 2023-08-12 DIAGNOSIS — Z08 Encounter for follow-up examination after completed treatment for malignant neoplasm: Secondary | ICD-10-CM | POA: Diagnosis not present

## 2023-08-18 DIAGNOSIS — Z23 Encounter for immunization: Secondary | ICD-10-CM | POA: Diagnosis not present

## 2023-12-20 DIAGNOSIS — Z1283 Encounter for screening for malignant neoplasm of skin: Secondary | ICD-10-CM | POA: Diagnosis not present

## 2023-12-20 DIAGNOSIS — X32XXXD Exposure to sunlight, subsequent encounter: Secondary | ICD-10-CM | POA: Diagnosis not present

## 2023-12-20 DIAGNOSIS — Z85828 Personal history of other malignant neoplasm of skin: Secondary | ICD-10-CM | POA: Diagnosis not present

## 2023-12-20 DIAGNOSIS — L570 Actinic keratosis: Secondary | ICD-10-CM | POA: Diagnosis not present

## 2023-12-20 DIAGNOSIS — Z08 Encounter for follow-up examination after completed treatment for malignant neoplasm: Secondary | ICD-10-CM | POA: Diagnosis not present

## 2023-12-20 DIAGNOSIS — D225 Melanocytic nevi of trunk: Secondary | ICD-10-CM | POA: Diagnosis not present

## 2023-12-20 DIAGNOSIS — D171 Benign lipomatous neoplasm of skin and subcutaneous tissue of trunk: Secondary | ICD-10-CM | POA: Diagnosis not present

## 2023-12-21 DIAGNOSIS — E782 Mixed hyperlipidemia: Secondary | ICD-10-CM | POA: Diagnosis not present

## 2023-12-21 DIAGNOSIS — I10 Essential (primary) hypertension: Secondary | ICD-10-CM | POA: Diagnosis not present

## 2023-12-26 DIAGNOSIS — F419 Anxiety disorder, unspecified: Secondary | ICD-10-CM | POA: Diagnosis not present

## 2023-12-26 DIAGNOSIS — Z0000 Encounter for general adult medical examination without abnormal findings: Secondary | ICD-10-CM | POA: Diagnosis not present

## 2023-12-26 DIAGNOSIS — I10 Essential (primary) hypertension: Secondary | ICD-10-CM | POA: Diagnosis not present

## 2023-12-26 DIAGNOSIS — E782 Mixed hyperlipidemia: Secondary | ICD-10-CM | POA: Diagnosis not present

## 2024-01-10 ENCOUNTER — Ambulatory Visit: Payer: Self-pay | Admitting: Surgery

## 2024-01-10 DIAGNOSIS — M7989 Other specified soft tissue disorders: Secondary | ICD-10-CM | POA: Diagnosis not present

## 2024-01-11 ENCOUNTER — Encounter (HOSPITAL_COMMUNITY): Payer: Self-pay | Admitting: Surgery

## 2024-01-11 NOTE — H&P (Signed)
REFERRING PHYSICIAN: Fran Lowes. MD  PROVIDER: Latorie Montesano Myra Rude, MD   Chief Complaint: New Consultation (LIPOMA RIGHT MID BACK)  History of Present Illness:  Patient is referred by Dr. Nita Sells, Montez Hageman. for surgical evaluation and management of a newly diagnosed soft tissue mass on the right upper back. This was seen on a recent dermatologic evaluation. Patient does not think it has been present for very long. It does cause discomfort when she leans against it such as in a hard back chair. Also she notes that when lying recumbent. She denies any other such lesions. She presents today accompanied by her husband for evaluation.  Review of Systems: A complete review of systems was obtained from the patient. I have reviewed this information and discussed as appropriate with the patient. See HPI as well for other ROS.  Review of Systems  Constitutional: Negative.  HENT: Negative.  Eyes: Negative.  Respiratory: Negative.  Cardiovascular: Negative.  Gastrointestinal: Negative.  Genitourinary: Negative.  Musculoskeletal: Negative.  Skin:  Soft tissue mass right upper back  Neurological: Negative.  Endo/Heme/Allergies: Negative.  Psychiatric/Behavioral: Negative.    Medical History: Past Medical History:  Diagnosis Date  Anxiety   Patient Active Problem List  Diagnosis  Soft tissue mass   Past Surgical History:  Procedure Laterality Date  BACK SURGERY  JOINT REPLACEMENT    No Known Allergies  Current Outpatient Medications on File Prior to Visit  Medication Sig Dispense Refill  aspirin 81 MG EC tablet Take 81 mg by mouth once daily  irbesartan (AVAPRO) 300 MG tablet Take 300 mg by mouth once daily  MAGNESIUM GLYCINATE ORAL Take by mouth  multivitamin tablet Take 1 tablet by mouth once daily  rosuvastatin (CRESTOR) 10 MG tablet Take 10 mg by mouth 3 (three) times a week   No current facility-administered medications on file prior to visit.   Family  History  Problem Relation Age of Onset  High blood pressure (Hypertension) Mother  Breast cancer Sister    Social History   Tobacco Use  Smoking Status Never  Smokeless Tobacco Never    Social History   Socioeconomic History  Marital status: Married  Tobacco Use  Smoking status: Never  Smokeless tobacco: Never  Vaping Use  Vaping status: Never Used  Substance and Sexual Activity  Alcohol use: Not Currently  Drug use: Never   Social Drivers of Health   Housing Stability: Unknown (01/10/2024)  Housing Stability Vital Sign  Homeless in the Last Year: No   Objective:   Vitals:  BP: 127/82  Pulse: 74  Temp: 36.3 C (97.3 F)  SpO2: 99%  Weight: 71.2 kg (157 lb)  Height: 165.1 cm (5\' 5" )  PainSc: 0-No pain   Body mass index is 26.13 kg/m.  Physical Exam   GENERAL APPEARANCE Comfortable, no acute issues Development: normal Gross deformities: none  SKIN Rash, lesions, ulcers: none Induration, erythema: none Nodules: There is a palpable soft tissue mass over the right paraspinous muscle just beneath the tip of the right scapula in the mid back. This is smooth, discrete, mobile consistent with a lipoma. There are no overlying cutaneous changes. The mass measures approximately 4 x 3 x 2 cm in size.  EYES Conjunctiva and lids: normal Pupils: equal  EARS, NOSE, MOUTH, THROAT External ears: no lesion or deformity External nose: no lesion or deformity Hearing: grossly normal  NECK Symmetric: yes Trachea: midline Thyroid: no palpable nodules in the thyroid bed  CHEST/CV Not assessed  ABDOMEN Not assessed  GENITOURINARY/RECTAL Not assessed  MUSCULOSKELETAL Station and gait: normal Digits and nails: no clubbing or cyanosis Muscle strength: grossly normal all extremities Deformity: none  LYMPHATIC Cervical: none palpable Supraclavicular: none palpable  PSYCHIATRIC Oriented to person, place, and time: yes Mood and affect: normal for  situation Judgment and insight: appropriate for situation   Assessment and Plan:   Soft tissue mass  Patient is referred by her dermatologist for surgical evaluation and management of a soft tissue mass on the right upper back. On examination, this appears to be a lipoma. It is discrete and mobile. It is minimally tender.  Today we discussed observation versus surgical excision. Patient would prefer to have this removed for definitive diagnosis. She has also been mildly symptomatic. We discussed surgical excision under local anesthesia with sedation as an outpatient surgical procedure. We discussed the size and location of the incision. We discussed restrictions on her activities for a week following the surgery. The patient understands and wishes to proceed in the near future.  Darnell Level, MD [facility] A [facility] practice Office: [phone]

## 2024-01-12 NOTE — Patient Instructions (Signed)
SURGICAL WAITING ROOM VISITATION Patients having surgery or a procedure may have no more than 2 support people in the waiting area - these visitors may rotate in the visitor waiting room.   Due to an increase in RSV and influenza rates and associated hospitalizations, children ages 33 and under may not visit patients in [facility] hospitals. If the patient needs to stay at the hospital during part of their recovery, the visitor guidelines for inpatient rooms apply.  PRE-OP VISITATION  Pre-op nurse will coordinate an appropriate time for 1 support person to accompany the patient in pre-op.  This support person may not rotate.  This visitor will be contacted when the time is appropriate for the visitor to come back in the pre-op area.  Please refer to the [facility] website for the visitor guidelines for Inpatients (after your surgery is over and you are in a regular room).  You are not required to quarantine at this time prior to your surgery. However, you must do this: Hand Hygiene often Do NOT share personal items Notify your provider if you are in close contact with someone who has COVID or you develop fever 100.4 or greater, new onset of sneezing, cough, sore throat, shortness of breath or body aches.  If you test positive for Covid or have been in contact with anyone that has tested positive in the last 10 days please notify you surgeon.    Your procedure is scheduled on:  Tuesday  January 17, 2024  Report to [facility] Main Entrance: Courtney Hurley entrance where the [employer] is available.   Report to admitting at: 07:45    AM  Call this number if you have any questions or problems the morning of surgery [phone]  Do not eat food after Midnight the night prior to your surgery/procedure.  After Midnight you may have the following liquids until   07:45 AM  DAY OF SURGERY  Clear Liquid Diet Water Black Coffee (sugar ok, NO MILK/CREAM OR CREAMERS)  Tea (sugar ok, NO  MILK/CREAM OR CREAMERS) regular and decaf                             Plain Jell-O  with no fruit (NO RED)                                           Fruit ices (not with fruit pulp, NO RED)                                     Popsicles (NO RED)                                                                  Juice: NO CITRUS JUICES: only apple, WHITE grape, WHITE cranberry Sports drinks like Gatorade or Powerade (NO RED)                FOLLOW ANY ADDITIONAL PRE OP INSTRUCTIONS YOU RECEIVED FROM YOUR SURGEON'S OFFICE!!!   Oral Hygiene is also important to reduce  your risk of infection.        Remember - BRUSH YOUR TEETH THE MORNING OF SURGERY WITH YOUR REGULAR TOOTHPASTE  Do NOT smoke after Midnight the night before surgery.  STOP TAKING all Vitamins, Herbs and supplements 1 week before your surgery.   Stop taking ASPIRIN 5-7 days before surgery.  Take ONLY these medicines the morning of surgery with A SIP OF WATER:  none                    You may not have any metal on your body including hair pins, jewelry, and body piercing  Do not wear make-up, lotions, powders, perfumes  or deodorant  Do not wear nail polish including gel and S&S, artificial / acrylic nails, or any other type of covering on natural nails including finger and toenails. If you have artificial nails, gel coating, etc., that needs to be removed by a nail salon, Please have this removed prior to surgery. Not doing so may mean that your surgery could be cancelled or delayed if the Surgeon or anesthesia staff feels like they are unable to monitor you safely.   Do not shave 48 hours prior to surgery to avoid nicks in your skin which may contribute to postoperative infections.   Contacts, Hearing Aids, dentures or bridgework may not be worn into surgery. DENTURES WILL BE REMOVED PRIOR TO SURGERY PLEASE DO NOT APPLY "Poly grip" OR ADHESIVES!!!  Patients discharged on the day of surgery will not be allowed to drive home.   Someone NEEDS to stay with you for the first 24 hours after anesthesia.  Do not bring your home medications to the hospital. The Pharmacy will dispense medications listed on your medication list to you during your admission in the Hospital.  Please read over the following fact sheets you were given: IF YOU HAVE QUESTIONS ABOUT YOUR PRE-OP INSTRUCTIONS, PLEASE CALL [phone]   [facility] - Preparing for Surgery Before surgery, you can play an important role.  Because skin is not sterile, your skin needs to be as free of germs as possible.  You can reduce the number of germs on your skin by washing with CHG (chlorahexidine gluconate) soap before surgery.  CHG is an antiseptic cleaner which kills germs and bonds with the skin to continue killing germs even after washing. Please DO NOT use if you have an allergy to CHG or antibacterial soaps.  If your skin becomes reddened/irritated stop using the CHG and inform your nurse when you arrive at Short Stay. Do not shave (including legs and underarms) for at least 48 hours prior to the first CHG shower.  You may shave your face/neck.  Please follow these instructions carefully:  1.  Shower with CHG Soap the night before surgery and the  morning of surgery.  2.  If you choose to wash your hair, wash your hair first as usual with your normal  shampoo.  3.  After you shampoo, rinse your hair and body thoroughly to remove the shampoo.                             4.  Use CHG as you would any other liquid soap.  You can apply chg directly to the skin and wash.  Gently with a scrungie or clean washcloth.  5.  Apply the CHG Soap to your body ONLY FROM THE NECK DOWN.   Do not use on face/  open                           Wound or open sores. Avoid contact with eyes, ears mouth and genitals (private parts).                       Wash face,  Genitals (private parts) with your normal soap.             6.  Wash thoroughly, paying special attention to the area where  your  surgery  will be performed.  7.  Thoroughly rinse your body with warm water from the neck down.  8.  DO NOT shower/wash with your normal soap after using and rinsing off the CHG Soap.            9.  Pat yourself dry with a clean towel.            10.  Wear clean pajamas.            11.  Place clean sheets on your bed the night of your first shower and do not  sleep with pets.  ON THE DAY OF SURGERY : Do not apply any lotions/deodorants the morning of surgery.  Please wear clean clothes to the hospital/surgery center.     FAILURE TO FOLLOW THESE INSTRUCTIONS MAY RESULT IN THE CANCELLATION OF YOUR SURGERY  PATIENT SIGNATURE_________________________________  NURSE SIGNATURE__________________________________  ________________________________________________________________________

## 2024-01-12 NOTE — Progress Notes (Signed)
COVID Vaccine received:  []  No [x]  Yes Date of any COVID positive Test in last 90 days:  PCP - Daisy Floro, MD at [facility] Cardiologist -   Chest x-ray - 2011 2v  Epic EKG - 2015    will repeat at PST  Stress Test -  ECHO -  Cardiac Cath -   PCR screen: []  Ordered & Completed []   No Order but Needs PROFEND     [x]   N/A for this surgery  Surgery Plan:  [x]  Ambulatory   []  Outpatient in bed  []  Admit Anesthesia:    []  General  []  Spinal  []   Choice [x]   MAC  Pacemaker / ICD device [x]  No []  Yes   Spinal Cord Stimulator:[x]  No []  Yes       History of Sleep Apnea? [x]  No []  Yes   CPAP used?- [x]  No []  Yes    Does the patient monitor blood sugar?   [x]  N/A   []  No []  Yes  Patient has: [x]  NO Hx DM   []  Pre-DM   []  DM1  []   DM2  Blood Thinner / Instructions:  none Aspirin Instructions: ASA 81 mg  ERAS Protocol Ordered: []  No  [x]  Yes PRE-SURGERY []  ENSURE  []  G2   [x]  No Drink Ordered Patient is to be NPO after: 0700  Dental hx: []  Dentures:  []  N/A      []  Bridge or Partial:                   []  Loose or Damaged teeth:   Comments:   Activity level: Patient is able / unable to climb a flight of stairs without difficulty; []  No CP  []  No SOB, but would have ___   Patient can / can not perform ADLs without assistance.   Anesthesia review: GERD, Anxiety,HTN, PONV  Patient denies shortness of breath, fever, cough and chest pain at PAT appointment.  Patient verbalized understanding and agreement to the Pre-Surgical Instructions that were given to them at this PAT appointment. Patient was also educated of the need to review these PAT instructions again prior to her surgery.I reviewed the appropriate phone numbers to call if they have any and questions or concerns.

## 2024-01-13 ENCOUNTER — Encounter (HOSPITAL_COMMUNITY): Payer: Self-pay

## 2024-01-13 ENCOUNTER — Encounter (HOSPITAL_COMMUNITY)
Admission: RE | Admit: 2024-01-13 | Discharge: 2024-01-13 | Disposition: A | Discharge status: Home / Self Care | Payer: Medicare PPO | Source: Ambulatory Visit | Attending: Surgery | Admitting: Surgery

## 2024-01-13 ENCOUNTER — Other Ambulatory Visit: Payer: Self-pay

## 2024-01-13 VITALS — BP 135/65 | HR 69 | Temp 98.1°F | Resp 14 | Ht 65.0 in | Wt 158.0 lb

## 2024-01-13 DIAGNOSIS — Z01818 Encounter for other preprocedural examination: Secondary | ICD-10-CM | POA: Diagnosis not present

## 2024-01-13 DIAGNOSIS — I10 Essential (primary) hypertension: Secondary | ICD-10-CM | POA: Insufficient documentation

## 2024-01-13 HISTORY — DX: Malignant (primary) neoplasm, unspecified: C80.1

## 2024-01-14 ENCOUNTER — Encounter (HOSPITAL_COMMUNITY): Payer: Self-pay | Admitting: Surgery

## 2024-01-14 DIAGNOSIS — M7989 Other specified soft tissue disorders: Secondary | ICD-10-CM | POA: Diagnosis present

## 2024-01-16 NOTE — Anesthesia Preprocedure Evaluation (Signed)
Anesthesia Evaluation  Patient identified by MRN, date of birth, ID band Patient awake    Reviewed: Allergy & Precautions, NPO status , Patient's Chart, lab work & pertinent test results  History of Anesthesia Complications (+) PONV and history of anesthetic complications  Airway Mallampati: II  TM Distance: >3 FB Neck ROM: Full    Dental no notable dental hx. (+) Teeth Intact, Dental Advisory Given   Pulmonary neg pulmonary ROS   Pulmonary exam normal breath sounds clear to auscultation       Cardiovascular hypertension, Pt. on medications Normal cardiovascular exam Rhythm:Regular Rate:Normal     Neuro/Psych  PSYCHIATRIC DISORDERS Anxiety     negative neurological ROS     GI/Hepatic negative GI ROS, Neg liver ROS,,,  Endo/Other  negative endocrine ROS    Renal/GU negative Renal ROS  negative genitourinary   Musculoskeletal  (+) Arthritis ,    Abdominal   Peds  Hematology negative hematology ROS (+)   Anesthesia Other Findings   Reproductive/Obstetrics                             Anesthesia Physical Anesthesia Plan  ASA: 2  Anesthesia Plan: MAC   Post-op Pain Management: Tylenol PO (pre-op)*   Induction: Intravenous  PONV Risk Score and Plan: 3 and Propofol infusion, Treatment may vary due to age or medical condition, Ondansetron and Dexamethasone  Airway Management Planned: Natural Airway  Additional Equipment:   Intra-op Plan:   Post-operative Plan:   Informed Consent: I have reviewed the patients History and Physical, chart, labs and discussed the procedure including the risks, benefits and alternatives for the proposed anesthesia with the patient or authorized representative who has indicated his/her understanding and acceptance.     Dental advisory given  Plan Discussed with: CRNA  Anesthesia Plan Comments:        Anesthesia Quick Evaluation

## 2024-01-17 ENCOUNTER — Ambulatory Visit (HOSPITAL_BASED_OUTPATIENT_CLINIC_OR_DEPARTMENT_OTHER): Payer: Medicare PPO | Admitting: Anesthesiology

## 2024-01-17 ENCOUNTER — Ambulatory Visit (HOSPITAL_COMMUNITY): Payer: Self-pay | Admitting: Physician Assistant

## 2024-01-17 ENCOUNTER — Other Ambulatory Visit: Payer: Self-pay

## 2024-01-17 ENCOUNTER — Encounter (HOSPITAL_COMMUNITY): Payer: Self-pay | Admitting: Surgery

## 2024-01-17 ENCOUNTER — Ambulatory Visit (HOSPITAL_COMMUNITY)
Admission: RE | Admit: 2024-01-17 | Discharge: 2024-01-17 | Disposition: A | Discharge status: Home / Self Care | Payer: Medicare PPO | Attending: Surgery | Admitting: Surgery

## 2024-01-17 ENCOUNTER — Encounter (HOSPITAL_COMMUNITY)
Admission: RE | Disposition: A | Discharge status: Home / Self Care | Payer: Self-pay | Source: Home / Self Care | Attending: Surgery

## 2024-01-17 DIAGNOSIS — D171 Benign lipomatous neoplasm of skin and subcutaneous tissue of trunk: Secondary | ICD-10-CM | POA: Insufficient documentation

## 2024-01-17 DIAGNOSIS — M7989 Other specified soft tissue disorders: Secondary | ICD-10-CM | POA: Diagnosis present

## 2024-01-17 DIAGNOSIS — M799 Soft tissue disorder, unspecified: Secondary | ICD-10-CM | POA: Diagnosis not present

## 2024-01-17 HISTORY — PX: MASS EXCISION: SHX2000

## 2024-01-17 SURGERY — EXCISION MASS
Anesthesia: Monitor Anesthesia Care | Laterality: Right

## 2024-01-17 MED ORDER — FENTANYL CITRATE (PF) 100 MCG/2ML IJ SOLN
INTRAMUSCULAR | Status: DC | PRN
  Administered 2024-01-17 (×2): 50 ug via INTRAVENOUS

## 2024-01-17 MED ORDER — PROPOFOL 500 MG/50ML IV EMUL
INTRAVENOUS | Status: DC | PRN
Start: 2024-01-17 — End: 2024-01-17
  Administered 2024-01-17: 100 ug/kg/min via INTRAVENOUS

## 2024-01-17 MED ORDER — 0.9 % SODIUM CHLORIDE (POUR BTL) OPTIME
TOPICAL | Status: DC | PRN
  Administered 2024-01-17: 1000 mL

## 2024-01-17 MED ORDER — ONDANSETRON HCL 4 MG/2ML IJ SOLN
INTRAMUSCULAR | Status: AC
  Filled 2024-01-17: qty 2

## 2024-01-17 MED ORDER — LIDOCAINE HCL (CARDIAC) PF 100 MG/5ML IV SOSY
PREFILLED_SYRINGE | INTRAVENOUS | Status: DC | PRN
  Administered 2024-01-17: 80 mg via INTRATRACHEAL

## 2024-01-17 MED ORDER — ACETAMINOPHEN 500 MG PO TABS
1000.0000 mg | ORAL_TABLET | Freq: Once | ORAL | Status: AC
  Administered 2024-01-17: 1000 mg via ORAL
  Filled 2024-01-17: qty 2

## 2024-01-17 MED ORDER — LACTATED RINGERS IV SOLN: INTRAVENOUS | Status: DC | PRN

## 2024-01-17 MED ORDER — ORAL CARE MOUTH RINSE: 15.0000 mL | Freq: Once | OROMUCOSAL | Status: AC

## 2024-01-17 MED ORDER — CHLORHEXIDINE GLUCONATE CLOTH 2 % EX PADS: 6.0000 | MEDICATED_PAD | Freq: Once | CUTANEOUS | Status: DC

## 2024-01-17 MED ORDER — DEXAMETHASONE SODIUM PHOSPHATE 10 MG/ML IJ SOLN
INTRAMUSCULAR | Status: AC
  Filled 2024-01-17: qty 1

## 2024-01-17 MED ORDER — EPHEDRINE SULFATE (PRESSORS) 50 MG/ML IJ SOLN
INTRAMUSCULAR | Status: DC | PRN
  Administered 2024-01-17 (×2): 5 mg via INTRAVENOUS

## 2024-01-17 MED ORDER — FENTANYL CITRATE PF 50 MCG/ML IJ SOSY
25.0000 ug | PREFILLED_SYRINGE | INTRAMUSCULAR | Status: DC | PRN
Start: 2024-01-17 — End: 2024-01-17

## 2024-01-17 MED ORDER — DEXMEDETOMIDINE HCL IN NACL 80 MCG/20ML IV SOLN
INTRAVENOUS | Status: DC | PRN
  Administered 2024-01-17: 4 ug via INTRAVENOUS
  Administered 2024-01-17: 8 ug via INTRAVENOUS

## 2024-01-17 MED ORDER — FENTANYL CITRATE (PF) 100 MCG/2ML IJ SOLN
INTRAMUSCULAR | Status: AC
  Filled 2024-01-17: qty 2

## 2024-01-17 MED ORDER — ONDANSETRON HCL 4 MG/2ML IJ SOLN
INTRAMUSCULAR | Status: DC | PRN
  Administered 2024-01-17: 4 mg via INTRAVENOUS

## 2024-01-17 MED ORDER — CHLORHEXIDINE GLUCONATE 0.12 % MT SOLN
15.0000 mL | Freq: Once | OROMUCOSAL | Status: AC
  Administered 2024-01-17: 15 mL via OROMUCOSAL

## 2024-01-17 MED ORDER — LACTATED RINGERS IV SOLN: INTRAVENOUS | Status: DC

## 2024-01-17 MED ORDER — BUPIVACAINE-EPINEPHRINE (PF) 0.5% -1:200000 IJ SOLN
INTRAMUSCULAR | Status: AC
  Filled 2024-01-17: qty 30

## 2024-01-17 MED ORDER — TRAMADOL HCL 50 MG PO TABS: 50.0000 mg | ORAL_TABLET | Freq: Four times a day (QID) | ORAL | 0 refills | Status: AC | PRN

## 2024-01-17 MED ORDER — CEFAZOLIN SODIUM-DEXTROSE 2-4 GM/100ML-% IV SOLN
2.0000 g | INTRAVENOUS | Status: DC
  Filled 2024-01-17: qty 100

## 2024-01-17 MED ORDER — BUPIVACAINE-EPINEPHRINE 0.5% -1:200000 IJ SOLN
INTRAMUSCULAR | Status: DC | PRN
  Administered 2024-01-17: 20 mL

## 2024-01-17 MED ORDER — DEXAMETHASONE SODIUM PHOSPHATE 4 MG/ML IJ SOLN
INTRAMUSCULAR | Status: DC | PRN
  Administered 2024-01-17: 5 mg via INTRAVENOUS

## 2024-01-17 SURGICAL SUPPLY — 29 items
BAG COUNTER SPONGE SURGICOUNT (BAG) IMPLANT
CHLORAPREP W/TINT 26 (MISCELLANEOUS) ×2 IMPLANT
COVER SURGICAL LIGHT HANDLE (MISCELLANEOUS) ×2 IMPLANT
DERMABOND ADVANCED .7 DNX12 (GAUZE/BANDAGES/DRESSINGS) IMPLANT
DRAPE LAPAROSCOPIC ABDOMINAL (DRAPES) IMPLANT
DRAPE LAPAROTOMY T 102X78X121 (DRAPES) IMPLANT
DRAPE LAPAROTOMY TRNSV 102X78 (DRAPES) IMPLANT
DRAPE SHEET LG 3/4 BI-LAMINATE (DRAPES) IMPLANT
ELECT REM PT RETURN 15FT ADLT (MISCELLANEOUS) ×2 IMPLANT
GAUZE SPONGE 4X4 12PLY STRL (GAUZE/BANDAGES/DRESSINGS) IMPLANT
GLOVE BIOGEL PI IND STRL 7.0 (GLOVE) ×2 IMPLANT
GLOVE SURG ORTHO 8.0 STRL STRW (GLOVE) ×2 IMPLANT
GLOVE SURG SYN 7.5 E (GLOVE) ×1
GLOVE SURG SYN 7.5 PF PI (GLOVE) ×2 IMPLANT
GOWN STRL REUS W/ TWL XL LVL3 (GOWN DISPOSABLE) ×4 IMPLANT
KIT BASIN OR (CUSTOM PROCEDURE TRAY) ×2 IMPLANT
KIT TURNOVER KIT A (KITS) IMPLANT
MARKER SKIN DUAL TIP RULER LAB (MISCELLANEOUS) IMPLANT
NDL HYPO 25X1 1.5 SAFETY (NEEDLE) ×2 IMPLANT
NEEDLE HYPO 25X1 1.5 SAFETY (NEEDLE) ×1
NS IRRIG 1000ML POUR BTL (IV SOLUTION) ×2 IMPLANT
PACK GENERAL/GYN (CUSTOM PROCEDURE TRAY) ×2 IMPLANT
SPIKE FLUID TRANSFER (MISCELLANEOUS) IMPLANT
SPONGE T-LAP 4X18 ~~LOC~~+RFID (SPONGE) IMPLANT
STAPLER SKIN PROX WIDE 3.9 (STAPLE) IMPLANT
STRIP CLOSURE SKIN 1/2X4 (GAUZE/BANDAGES/DRESSINGS) IMPLANT
SUT VIC AB 3-0 SH 18 (SUTURE) IMPLANT
SYR CONTROL 10ML LL (SYRINGE) ×2 IMPLANT
TOWEL OR 17X26 10 PK STRL BLUE (TOWEL DISPOSABLE) ×2 IMPLANT

## 2024-01-17 NOTE — Transfer of Care (Signed)
Immediate Anesthesia Transfer of Care Note  Patient: Courtney Hurley  Procedure(s) Performed: EXCISION SOFT TISSUE MASS RIGHT UPPER BACK (Right)  Patient Location: PACU  Anesthesia Type:General  Level of Consciousness: awake and alert   Airway & Oxygen Therapy: Patient Spontanous Breathing and Patient connected to nasal cannula oxygen  Post-op Assessment: Report given to RN and Post -op Vital signs reviewed and stable  Post vital signs: Reviewed and stable  Last Vitals:  Vitals Value Taken Time  BP 98/55 01/17/24 1150  Temp    Pulse 79 01/17/24 1151  Resp 18 01/17/24 1151  SpO2 97 % 01/17/24 1151  Vitals shown include unfiled device data.  Last Pain:  Vitals:   01/17/24 0814  TempSrc:   PainSc: 0-No pain         Complications: No notable events documented.

## 2024-01-17 NOTE — Op Note (Signed)
Operative Note  Pre-operative Diagnosis:  soft tissue mass of back  Post-operative Diagnosis:  same  Surgeon:  Darnell Level, MD  Assistant:  none   Procedure:  excision of soft tissue mass of right upper back, 4 x 3 x 2.5 cm, subcutaneous  Anesthesia:  local with IV sedation  Estimated Blood Loss:  minimal  Drains: none         Specimen: to pathology  Indications:  Patient is referred by Dr. Nita Sells, Montez Hageman. for surgical evaluation and management of a newly diagnosed soft tissue mass on the right upper back. This was seen on a recent dermatologic evaluation. Patient does not think it has been present for very long. It does cause discomfort when she leans against it such as in a hard back chair. Also she notes that when lying recumbent. She denies any other such lesions. She presents today accompanied by her husband for evaluation.   Procedure:  The patient was seen in the pre-op holding area. The risks, benefits, complications, treatment options, and expected outcomes were previously discussed with the patient. The patient agreed with the proposed plan and has signed the informed consent form.  The patient was brought to the operating room by the surgical team, identified as Courtney Hurley and the procedure verified. A "time out" was completed and the above information confirmed.  Following administration of intravenous sedation, the patient was positioned and then prepped and draped in the usual aseptic fashion.  After ascertaining that an adequate level of sedation had been achieved, the skin was anesthetized with local anesthetic.  This was infiltrated into the deeper subcutaneous tissues.  Using a #15 blade an incision was made over the soft tissue mass in the right upper back overlying the paraspinous muscle.  Dissection was carried into the subcutaneous tissues using the electrocautery for hemostasis.  The mass was identified.  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  There was  some fibrous connective tissue.  The mass was mobilized and excised in its entirety down to the underlying paraspinous musculature.  The mass measured 4 x 3 x 2.5 cm and was submitted in its entirety to pathology for review.  Good hemostasis is obtained throughout the operative field.  Subcutaneous tissues are closed with interrupted 3-0 Vicryl sutures.  Skin is closed with a running 4-0 Monocryl subcuticular suture.  Wound was washed and dried and Dermabond is applied as dressing.  Patient is awakened from anesthesia and transferred to the recovery room.  The patient tolerated the procedure well.   Darnell Level, MD [facility] Surgery Office: [phone]

## 2024-01-17 NOTE — Interval H&P Note (Signed)
History and Physical Interval Note:  01/17/2024 8:46 AM  Courtney Hurley  has presented today for surgery, with the diagnosis of SOFT TISSUE MASS.  The various methods of treatment have been discussed with the patient and family. After consideration of risks, benefits and other options for treatment, the patient has consented to    Procedure(s): EXCISION SOFT TISSUE MASS RIGHT UPPER BACK (Right) as a surgical intervention.    The patient's history has been reviewed, patient examined, no change in status, stable for surgery.  I have reviewed the patient's chart and labs.  Questions were answered to the patient's satisfaction.    Darnell Level, MD [facility] practice Office: [phone]   Darnell Level

## 2024-01-17 NOTE — Discharge Instructions (Addendum)
  [facility] -- DISCHARGE INSTRUCTIONS  REMINDER:   Carry a list of your medications and allergies with you at all times  Call your pharmacy at least 1 week in advance to refill prescriptions  Do not mix any prescribed pain medicine with alcohol  Do not drive any motor vehicles while taking pain medication  Take medications with food unless otherwise directed  Follow-up appointments (date to return to physician): Please call [phone] to confirm your follow up appointment with your surgeon.  Call your Surgeon if you have:  Temperature greater than 101.0  Persistent nausea and vomiting  Severe uncontrolled pain  Redness, tenderness, or signs of infection (pain, swelling, redness, odor or green/yellow discharge around the site)  Difficulty breathing, headache or visual disturbances  Hives  Persistent dizziness or light-headedness  Any other questions or concerns you may have after discharge  In an emergency, call 911 or go to an Emergency Department at a nearby hospital.  Diet: Begin with liquids, and if they are tolerated, resume your usual diet.  Avoid spicy, greasy or heavy foods.  If you have nausea or vomiting, go back to liquids.  If you cannot keep liquids down, call your doctor.  Avoid alcohol consumption while on prescription pain medications. Good nutrition promotes healing. Increase fiber and fluids.   ADDITIONAL INSTRUCTIONS: Leave Dermabond in place for 7 to 10 days.  May shower at any time.  Apply ice pack as needed for first 2 to 3 days for pain control.  Prescription for pain medication is available at the pharmacy.  [facility] Office: [phone]

## 2024-01-17 NOTE — Anesthesia Postprocedure Evaluation (Signed)
Anesthesia Post Note  Patient: Courtney Hurley  Procedure(s) Performed: EXCISION SOFT TISSUE MASS RIGHT UPPER BACK (Right)     Patient location during evaluation: PACU Anesthesia Type: MAC Level of consciousness: awake and alert Pain management: pain level controlled Vital Signs Assessment: post-procedure vital signs reviewed and stable Respiratory status: spontaneous breathing, nonlabored ventilation, respiratory function stable and patient connected to nasal cannula oxygen Cardiovascular status: stable and blood pressure returned to baseline Postop Assessment: no apparent nausea or vomiting Anesthetic complications: no  No notable events documented.  Last Vitals:  Vitals:   01/17/24 1215 01/17/24 1238  BP: 117/65 133/68  Pulse: 64 64  Resp: 16 16  Temp:  (!) 36.4 C  SpO2: 98% 98%    Last Pain:  Vitals:   01/17/24 1238  TempSrc: Oral  PainSc: 0-No pain                 Taheera Thomann L Rhylee Pucillo

## 2024-01-18 ENCOUNTER — Encounter (HOSPITAL_COMMUNITY): Payer: Self-pay | Admitting: Surgery

## 2024-01-18 ENCOUNTER — Encounter: Payer: Self-pay | Admitting: Surgery

## 2024-01-18 LAB — SURGICAL PATHOLOGY

## 2024-01-18 NOTE — Progress Notes (Signed)
Pathology is benign as expected.  Consistent with lipoma.  Good news!  Darnell Level, MD [facility] practice Office: [phone]

## 2024-01-30 DIAGNOSIS — L258 Unspecified contact dermatitis due to other agents: Secondary | ICD-10-CM | POA: Diagnosis not present

## 2024-01-30 DIAGNOSIS — Z85820 Personal history of malignant melanoma of skin: Secondary | ICD-10-CM | POA: Diagnosis not present

## 2024-01-30 DIAGNOSIS — Z08 Encounter for follow-up examination after completed treatment for malignant neoplasm: Secondary | ICD-10-CM | POA: Diagnosis not present

## 2024-03-29 DIAGNOSIS — Z961 Presence of intraocular lens: Secondary | ICD-10-CM | POA: Diagnosis not present

## 2024-03-29 DIAGNOSIS — H43811 Vitreous degeneration, right eye: Secondary | ICD-10-CM | POA: Diagnosis not present

## 2024-03-29 DIAGNOSIS — H16223 Keratoconjunctivitis sicca, not specified as Sjogren's, bilateral: Secondary | ICD-10-CM | POA: Diagnosis not present

## 2024-06-08 DIAGNOSIS — D225 Melanocytic nevi of trunk: Secondary | ICD-10-CM | POA: Diagnosis not present

## 2024-06-08 DIAGNOSIS — Z1283 Encounter for screening for malignant neoplasm of skin: Secondary | ICD-10-CM | POA: Diagnosis not present

## 2024-06-08 DIAGNOSIS — Z08 Encounter for follow-up examination after completed treatment for malignant neoplasm: Secondary | ICD-10-CM | POA: Diagnosis not present

## 2024-06-08 DIAGNOSIS — Z85820 Personal history of malignant melanoma of skin: Secondary | ICD-10-CM | POA: Diagnosis not present

## 2024-06-08 DIAGNOSIS — D2372 Other benign neoplasm of skin of left lower limb, including hip: Secondary | ICD-10-CM | POA: Diagnosis not present

## 2024-06-15 ENCOUNTER — Encounter: Payer: Self-pay | Admitting: Advanced Practice Midwife

## 2024-06-25 DIAGNOSIS — I10 Essential (primary) hypertension: Secondary | ICD-10-CM | POA: Diagnosis not present

## 2024-06-25 DIAGNOSIS — F419 Anxiety disorder, unspecified: Secondary | ICD-10-CM | POA: Diagnosis not present

## 2024-06-25 DIAGNOSIS — Z6826 Body mass index (BMI) 26.0-26.9, adult: Secondary | ICD-10-CM | POA: Diagnosis not present

## 2024-06-25 DIAGNOSIS — F409 Phobic anxiety disorder, unspecified: Secondary | ICD-10-CM | POA: Diagnosis not present

## 2024-07-11 ENCOUNTER — Ambulatory Visit: Admitting: Psychiatry
